# Patient Record
Sex: Female | Born: 1965 | Race: White | Hispanic: No
Health system: Western US, Academic
[De-identification: ages and names within clinical notes are randomized; demographics above are authoritative.]

## PROBLEM LIST (undated history)

## (undated) DIAGNOSIS — R519 Headache, unspecified: Secondary | ICD-10-CM

## (undated) DIAGNOSIS — K469 Unspecified abdominal hernia without obstruction or gangrene: Secondary | ICD-10-CM

## (undated) DIAGNOSIS — E119 Type 2 diabetes mellitus without complications: Secondary | ICD-10-CM

## (undated) DIAGNOSIS — J45909 Unspecified asthma, uncomplicated: Secondary | ICD-10-CM

## (undated) DIAGNOSIS — C50919 Malignant neoplasm of unspecified site of unspecified female breast: Secondary | ICD-10-CM

## (undated) DIAGNOSIS — B001 Herpesviral vesicular dermatitis: Secondary | ICD-10-CM

## (undated) DIAGNOSIS — J309 Allergic rhinitis, unspecified: Secondary | ICD-10-CM

## (undated) HISTORY — DX: Type 2 diabetes mellitus without complications: E11.9

## (undated) HISTORY — DX: Unspecified asthma, uncomplicated: J45.909

## (undated) HISTORY — PX: PR MAMMAPLASTY AUGMENTATION W/O PROSTHETIC IMPLANT: 19324

## (undated) HISTORY — PX: NO PRIOR SURGERIES: 100

## (undated) HISTORY — DX: Herpesviral vesicular dermatitis: B00.1

## (undated) HISTORY — PX: BREAST LUMPECTOMY: SHX5074

## (undated) HISTORY — DX: Headache, unspecified: R51.9

## (undated) HISTORY — PX: SKIN CANCER EXCISION: SHX5110

## (undated) HISTORY — PX: PR APPENDECTOMY: 44950

## (undated) HISTORY — DX: Allergic rhinitis, unspecified: J30.9

## (undated) HISTORY — PX: BREAST RECONSTRUCTION: SHX5075

## (undated) HISTORY — DX: Unspecified abdominal hernia without obstruction or gangrene: K46.9

## (undated) HISTORY — DX: Malignant neoplasm of unspecified site of unspecified female breast: C50.919

## (undated) HISTORY — PX: LYMPH NODE SURGERY: SHX5161

---

## 2006-09-12 DIAGNOSIS — Z923 Personal history of irradiation: Secondary | ICD-10-CM

## 2006-09-12 HISTORY — DX: Personal history of irradiation: Z92.3

## 2014-09-12 DIAGNOSIS — C4490 Unspecified malignant neoplasm of skin, unspecified: Secondary | ICD-10-CM

## 2014-09-12 HISTORY — PX: HERNIA REPAIR: SHX5222

## 2014-09-12 HISTORY — DX: Unspecified malignant neoplasm of skin, unspecified: C44.90

## 2018-09-12 DIAGNOSIS — I10 Essential (primary) hypertension: Secondary | ICD-10-CM

## 2018-09-12 HISTORY — DX: Essential (primary) hypertension: I10

## 2020-07-09 ENCOUNTER — Encounter (HOSPITAL_BASED_OUTPATIENT_CLINIC_OR_DEPARTMENT_OTHER): Payer: Self-pay | Admitting: Plastic and Reconstructive Surgery

## 2020-07-15 ENCOUNTER — Telehealth (HOSPITAL_BASED_OUTPATIENT_CLINIC_OR_DEPARTMENT_OTHER): Payer: Self-pay

## 2020-07-15 NOTE — Telephone Encounter (Signed)
RETURN CALL: Voicemail - Detailed Message      SUBJECT:  General Message     MESSAGE: Margie from [place] is calling to check on the referral status. Lesleigh Noe would like a call back once patient is scheduled so she can start the prior authorization process for patient.

## 2020-07-16 ENCOUNTER — Telehealth (HOSPITAL_BASED_OUTPATIENT_CLINIC_OR_DEPARTMENT_OTHER): Payer: Self-pay | Admitting: Physician Assistant

## 2020-07-16 NOTE — Telephone Encounter (Signed)
RETURN CALL: Voicemail - Not Available      SUBJECT:  Form/Letter/Paperwork Request     DATE NEEDED BY: ASAP  PICK UP/FAX/MAIL: MAIL  ADDITIONAL INFORMATION: Letter for referral was generated today, patients address was incorrect on file. [facility] updated and verified patient demographic information. Please mail to corrected address.    Thanks

## 2020-07-16 NOTE — Telephone Encounter (Signed)
Returned call to [place] at [facility]. Left message to call PSS directly to discuss referral. PSS unable to establish Margie's position with this referral at time of call.

## 2020-12-29 ENCOUNTER — Telehealth (HOSPITAL_BASED_OUTPATIENT_CLINIC_OR_DEPARTMENT_OTHER): Payer: Self-pay

## 2020-12-29 NOTE — Telephone Encounter (Signed)
RETURN CALL: Voicemail - Detailed Message      SUBJECT:  Cancellation/Reschedule Request     REASON: Request for sooner appointment  ADDITIONAL INFORMATION: Patient would like to be seen sooner if possible or have appointment added to a wait list.

## 2021-02-09 ENCOUNTER — Ambulatory Visit (HOSPITAL_BASED_OUTPATIENT_CLINIC_OR_DEPARTMENT_OTHER): Payer: No Typology Code available for payment source | Admitting: Plastic Surgery

## 2021-02-09 ENCOUNTER — Ambulatory Visit: Payer: No Typology Code available for payment source | Attending: Plastic Surgery | Admitting: Plastic Surgery

## 2021-02-09 ENCOUNTER — Encounter (HOSPITAL_BASED_OUTPATIENT_CLINIC_OR_DEPARTMENT_OTHER): Payer: Self-pay | Admitting: Plastic Surgery

## 2021-02-09 VITALS — BP 127/85 | HR 106 | Temp 99.1°F | Ht 64.0 in | Wt 135.0 lb

## 2021-02-09 DIAGNOSIS — C7951 Secondary malignant neoplasm of bone: Secondary | ICD-10-CM | POA: Insufficient documentation

## 2021-02-09 DIAGNOSIS — C50511 Malignant neoplasm of lower-outer quadrant of right female breast: Secondary | ICD-10-CM | POA: Insufficient documentation

## 2021-02-09 DIAGNOSIS — C7802 Secondary malignant neoplasm of left lung: Secondary | ICD-10-CM | POA: Insufficient documentation

## 2021-02-09 DIAGNOSIS — C7801 Secondary malignant neoplasm of right lung: Secondary | ICD-10-CM | POA: Insufficient documentation

## 2021-02-09 DIAGNOSIS — I890 Lymphedema, not elsewhere classified: Secondary | ICD-10-CM | POA: Insufficient documentation

## 2021-02-09 NOTE — Progress Notes (Deleted)
Outpatient Visit Note       CC/History of Present Illness:  Ariana Johnson is a 55 year old female with history of right ER+, PR-, Her2- breast cancer diagnosed in 2008 s/p lumpectomy, axillary lymph node dissection, and chemoradiation. She recalls that 5 nodes were removed during surgery. Patient reports she started to notice swelling in her right arm in 2012, which she was able to control with sleeves, physical therapy, and a compression device. While she had good success with these strategies at first, she has noticed symptoms to be refractory Notably, she has had four episodes cellulitis of the right arm requiring admission in 2016 and 2019. Notably, patient is right handed and has noticed increased discomfort with daily activities for the last three years which has been impairing her quality of life.        For current disease status, patient was diagnosed with local recurrence followed by metastatic disease to her bone and lung. Metastasis have been stable on systemic therapy, and she is currently being treated with Piqray. Her most recent A1c was 7.6 one month ago.            Past Medical history  Past Medical History:   Diagnosis Date   . Hypertension 2020   . Skin cancer 2016   . History of radiation therapy 2008    Sept - Dec 2008   . Allergic rhinitis due to allergen    . Asthma    . Breast cancer ([place])     Stage 2- 2008 then Stage 4 12/2016   . Diabetes mellitus ([place])    . Headache    . Hernia of abdominal cavity     Umblical   . Recurrent cold sores     Occasionally       Past Surgical history  Past Surgical History:   Procedure Laterality Date   . BREAST BIOPSY     . BREAST LUMPECTOMY     . HERNIA REPAIR  7829    Umblical   . LYMPH NODE SURGERY     . PR APPENDECTOMY     . PR MAMMAPLASTY AUGMENTATION W/O PROSTHETIC IMPLANT         Medications  Outpatient Medications Prior to Visit   Medication Sig Dispense Refill   . amLODIPine 10 MG tablet      . atorvastatin 20 MG tablet      . Fulvestrant (FASLODEX  IM) Inject intramuscularly every month.     . Jardiance 25 MG tablet      . loratadine 10 MG tablet Take 10 mg by mouth daily.     . metFORMIN 500 MG tablet Take 500 mg by mouth daily.     . montelukast 10 MG tablet      . Multiple Vitamins-Minerals (ONE-A-DAY WOMENS OR) Take by mouth daily.     . NON-FORMULARY      . Omega-3 Fatty Acids (OMEGA-3 OR) Take by mouth daily.     Marland Kitchen omeprazole 20 MG DR capsule      . Piqray (300 MG Daily Dose) 2 x 150 MG tablet therapy pack        No facility-administered medications prior to visit.       Family history  Family History     Problem (# of Occurrences) Relation (Name,Age of Onset)    Birth Defects (1) Brother: Heart    Breast Cancer (1) Paternal Grandmother    Cancer (2) Father: Mesothelioma, Daughter: Hodkins lymphoma  Clotting Disorder (2) Mother: Kidney, Other: Aunt- Brain; Cousins- Lung    Diabetes (2) Mother, Father    Hearing Loss (1) Father    Skin Cancer (1) Mother    Vision Loss (31) Mother, Father, Sister, Brother          Patient denies family history of prostate, bladder, kidney, testicular cancer    Allergy  Review of patient's allergies indicates:  Allergies   Allergen Reactions   . Latex Resp: Shortness of breath   . Sulfa Drugs [Sulfa Antibiotics] Angioedema/lip, tongue swelling     "Lip swelling"   . Lactose Intolerance (Gi) Other     Upset stomach        Social history  Tobacco Use: Medium Risk   . Smoking Tobacco Use: Former Smoker   . Smokeless Tobacco Use: Never Used       Review of Systems:    Physical Exam:  BP 127/85   Pulse (!) 106   Temp 37.3 C (Temporal)   Ht _0  (1.626 m)   Wt 61.2 kg (135 lb)   SpO2 99%   BMI 23.17 kg/m     Physical Exam    Imaging/Diagnostics:    Imaging Results:         Assessment/Plan:      It was a pleasure to see Ariana Johnson in clinic today.       Discussed the course of lymphedema and the role of surgery, including physiological options such as bypass or lymph node packet transplant, and reductive options,  including liposuction. Discussed that, without lymphoscintigraphy, it is difficult to determine if patient's remaining lymphatic tissues are functional and amenable to physiological options, which would not only reduce lymphedema symptoms but decrease risk of recurrent infections. If study should reveal loss of lymphatic function, she would not likely be a candidate for functional intervention and reductive intervention with liposuction would be her mainstay of therapy.     Plan for lymphoscintigraphy and MR lymphangiogram. Will plan for clinic follow-up in two months to discuss results of images.

## 2021-02-09 NOTE — Progress Notes (Deleted)
error 

## 2021-02-11 ENCOUNTER — Encounter (HOSPITAL_BASED_OUTPATIENT_CLINIC_OR_DEPARTMENT_OTHER): Payer: Self-pay | Admitting: Plastic Surgery

## 2021-02-14 NOTE — Addendum Note (Signed)
Addended by: Valetta Mole B on: 02/14/2021 10:08 PM     Modules accepted: Level of Service

## 2021-02-14 NOTE — Progress Notes (Signed)
PLASTIC SURGERY CLINIC VISIT NOTE    Reason for visit: New Patient Consult (RUE swelling & pain)    HPI:  Ariana Johnson is a 55 year old female RHD presenting for evaluation of RUE lymphedema, She has a history of right ER+, PR-, Her2- breast cancer in 2008 s/p right lumpectomy, axillary dissection (5 nodes removed), and chemoradiation.  She reports that she first experienced right arm swelling in 2012.  She has used compression sleeves, a pump, and worked with a lymphedema therapist for complex decongestive therapy.  Despite all of this, her lymphedema has progressed. She required admission for IV abx for cellulitis on four occassions.    She reports that her right arm is heavy, has constant discomfort, and has negatively impacted her activities of daily living and quality of life.  She reports that her hand swells as well. The swelling is worse as the day progresses.    Notably, she was found to have a local recurrence that is metastatic to her bone and lung.  She is currently being treated with Piqray therapy and reports that the progression of her disease has stabilized on the drug.      PAST MEDICAL HISTORY:  Past Medical History:   Diagnosis Date   . Hypertension 2020   . Skin cancer 2016   . History of radiation therapy 2008    Sept - Dec 2008   . Allergic rhinitis due to allergen    . Asthma    . Breast cancer ([place])     Stage 2- 2008 then Stage 4 12/2016   . Diabetes mellitus ([place])    . Headache    . Hernia of abdominal cavity     Umblical   . NEGATIVE PAST MEDICAL HISTORY OF Metastatic breast cancer    Lymphadema 2012   . Recurrent cold sores     Occasionally       PAST SURGICAL HISTORY:  Past Surgical History:   Procedure Laterality Date   . BREAST BIOPSY     . BREAST LUMPECTOMY     . BREAST RECONSTRUCTION  L breast plasty 2016   . HERNIA REPAIR  4854    Umblical   . LYMPH NODE SURGERY     . NO PRIOR SURGERIES  2008   . PR APPENDECTOMY     . PR MAMMAPLASTY AUGMENTATION W/O PROSTHETIC IMPLANT     . SKIN  CANCER EXCISION  2016       MEDICATIONS:  Current Outpatient Medications   Medication Sig Dispense Refill   . amLODIPine 10 MG tablet      . atorvastatin 20 MG tablet      . Fulvestrant (FASLODEX IM) Inject intramuscularly every month.     . Jardiance 25 MG tablet      . loratadine 10 MG tablet Take 10 mg by mouth daily.     . metFORMIN 500 MG tablet Take 500 mg by mouth daily.     . montelukast 10 MG tablet      . Multiple Vitamins-Minerals (ONE-A-DAY WOMENS OR) Take by mouth daily.     . NON-FORMULARY      . Omega-3 Fatty Acids (OMEGA-3 OR) Take by mouth daily.     Marland Kitchen omeprazole 20 MG DR capsule      . Piqray (300 MG Daily Dose) 2 x 150 MG tablet therapy pack        No current facility-administered medications for this visit.       ALLERGIES:  Review of patient's allergies indicates:  Allergies   Allergen Reactions   . Latex Resp: Shortness of breath   . Sulfa Drugs [Sulfa Antibiotics] Angioedema/lip, tongue swelling     "Lip swelling"   . Lactose Intolerance (Gi) Other     Upset stomach        FAMILY HISTORY:  family history includes Birth Defects in her brother; Breast Cancer in her paternal grandmother; Cancer in her daughter, father, and mother; Clotting Disorder in her mother and another family member; Diabetes in her father and mother; Hearing Loss in her father; Skin Cancer in her mother; Stroke in her father; Vision Loss in her brother, father, mother, and sister.      SOCIAL HISTORY:  Social History     Tobacco Use   . Smoking status: Former Smoker     Types: Cigarettes     Quit date: 1996     Years since quitting: 26.4   . Smokeless tobacco: Never Used   Substance Use Topics   . Alcohol use: Yes     Alcohol/week: 1.0 standard drink     Types: 1 Cans of beer per week     Comment: Maybe 1-2 weekly   . Drug use: Never       REVIEW OF SYSTEMS:  Thorough review of systems is negative for any complaint not mentioned above in the HPI. Patient specifically denies chest pain, shortness of breath, change in bowel  habits, changes in weight, fatigue/anorexia, neurologic symptoms.    PHYSICAL EXAM:  BP 127/85   Pulse (!) 106   Temp 37.3 C (Temporal)   Ht 5' 4"  (1.626 m)   Wt 61.2 kg (135 lb)   SpO2 99%   BMI 23.17 kg/m   General: resting comfortably, no distress  Focused examination of bilateral upper extremities:  Right arm is diffusely swollen and larger than her left arm. Nonpitting edema present on upper arm and forearm. Pitting edema overlying hand. + Stemmers sign. No open wounds. Skin appears reasonable quality. Axilla without significant scaring.    LDex: 69.6    ASSESSMENT/PLAN:  Ariana Johnson is a very pleasant 55 year old woman with prior history of right breast cancer s/p lumpectomy, axillary dissection (removal of 5 LNs) and radiation now with metastatic disease stable on Piqray therapy presenting with Grade 2 lymphedema of the right arm that is significantly impairing her quality of life.     Discussed the diagnosis and treatment of lymphedema with the patient and may be amenable to lymphatic procedures as she still demonstrates pitting edema in her hand.  We informed the patient that the goals of lymphatic surgery are to 1) decrease the subjective symptoms of lymphedema, 2) decrease risk of infections, and 3) decrease the burden of compression and exercises, but the lymphedema will likely not fully go away.     Surgical options for lymphedema are divided into two categories: Excisional (ablative) and Reconstructive (physiologic). Excisional procedures include the [facility] Procedure, direct excision of lymphedematous folds and liposuction. None of these procedures actually treat the lymphedema but they do treat the sequelae of lymphedema. Patients still require compression to treat their lymphedema. There are essentially 2 reconstructive procedures that can be done. The first is Lymphatico-venular anastomosis,(LVA) and the second is vascularized lymph node transfer (VLNT).     LVA works by connecting tiny  lymphatics just under the dermis to small venules with the goal to dump the lymphatic fluid into the circulatory system in the arm. Since the venous  system is functioning normally, this helps transport the lymph out of the limb. Typically between 4 and 10 anastomoses are done, or at least as many as possible. This is an easy operation from the point of view of the patient since it is an outpatient procedure, takes about 4 hrs, and the incisions are very small and very superficial so there is not a lot of downtime. There is very little downside to this operation though there is a theoretical increased risk of post-operative wound infection.     VLNT is intuitively a good operation since it consists of replacing lymph nodes in the area from which they have been removed. These lymph nodes secrete a protein called VEGF-C which is lymphangiogenic and causes new lymph channels to grow out into the surrounding tissue. Lymph nodes can be harvested from the groin, the lateral thoracic region, or the neck, either supraclavicular or submental. Lymphoid tissue can also be harvested from the omentum and this is done laparoscopically. The major downside of VLNT is the risk (though small) of inducing lymphedema at the donor site.      One of the biggest decisions to be made is when to do which operation. To determine this, an MRI Lymphangiogram will be performed. This can show the lymphatic channels. If we can see functioning lymphatics, then LVA may work well. If there are none, then VLNT is the better operation. Outcomes seem to be similar for both procedures with approximately 10% of patients achieving a spectacular result, 10% of patients having no response and the majority having some response. For most patients in this category, they are able to reduce their reliance on compression though most will still require it, especially in situations where they know the limb is going to swell (e.g. a plane ride).       The procedures  were discussed in detail with the patient today including expected recovery, pain, activity limitations, and potential complications.  Risks including but not limited to infection, bleeding, wound healing complications, scarring, DVT, PE, stroke, and general anesthesia risks were discussed.     We will order a lymphoscintigraphy and MRL for this patient of her right upper extremity and have her return to discuss the results.    In addition, we may require her to stop her Piqray therapy during the perioperative period.  We will plan to have a further discussions with her oncologist pending the results of the ordered studies.    - RTC in 2 months to discuss lymphoscintigraphy, MRL, and surgical options.  - Continue with daily compression and decongestive therapy.    Rejeana Brock Rhodia Albright, MD  Attending Physician, Assistant Professor  Division of [place] [facility] [place]. [place]  [place], [place], [place]

## 2021-03-08 NOTE — Progress Notes (Deleted)
{  PLASSITE:[id number]} - Follow Up    Ariana Johnson, Ariana Johnson  DOB: [birth year]  CC: follow up for lymphedema evaluation         SUBJECTIVE   Ariana Johnson is a 55 year old female with history of R breast cancer s/p lumpectomy, axillary dissection (5 nodes removed), and chemoradiation (2008), and RUE lymphedema presenting for further management of RUE swelling.     Pt reports she first experienced R arm swelling in 2012. She initially used compression sleeves, a pump, and lymphedema specialist but her lymphedema continued to progress. She required admission for cellulitis and IV abx on several occasions. Notably, she had recurrence of breast cancer with mets to bone and lung, for which she is being tx with Piqray therapy. She was first seen on 02/09/21, at which time she reported heavy right arm and constant discomfort. Surgical treatments were discussed, including LVA and VLNT, but it was decided she would need MRI lymphangiogram first, which showed ________    ROS  Attached Review of Systems (entered/ scanned by MA) reviewed and confirmed? {YES - DEFAULT:107767::"YES"}       OBJECTIVE   There were no vitals taken for this visit.  PHYSICAL EXAM  @CJKPHYSICALEXAMCLINIC @    RECORDS REVIEW  MICRO:   {Microbiology Results (multi-select):114230::" "}    IMAGING:   {Imaging AYOKHTX:774142::" "}       ASSESSMENT AND PLAN   Ariana Johnson is a 55 year old female with history of R breast cancer s/p lumpectomy, axillary dissection (5 nodes removed), and chemoradiation (2008), and RUE lymphedema presenting for further management of RUE swelling.

## 2021-03-10 ENCOUNTER — Inpatient Hospital Stay (HOSPITAL_COMMUNITY)
Admit: 2021-03-10 | Discharge: 2021-03-10 | Disposition: A | Discharge status: Home / Self Care | Payer: No Typology Code available for payment source | Source: Home / Self Care

## 2021-03-10 ENCOUNTER — Inpatient Hospital Stay
Admission: RE | Admit: 2021-03-10 | Discharge: 2021-03-10 | Disposition: A | Discharge status: Home / Self Care | Payer: No Typology Code available for payment source | Attending: Nuclear Medicine | Admitting: Nuclear Medicine

## 2021-03-10 ENCOUNTER — Encounter (HOSPITAL_COMMUNITY): Payer: Self-pay

## 2021-03-10 DIAGNOSIS — I890 Lymphedema, not elsewhere classified: Secondary | ICD-10-CM

## 2021-03-10 MED ORDER — TECHNETIUM TC-99M FILTERED SULFUR COLLOID UWM SOLUTION
1.0000 | Freq: Once | Status: DC | PRN
Start: 2021-03-10 — End: 2021-03-11
  Administered 2021-03-10: 1.05 via INTRADERMAL

## 2021-03-10 MED ORDER — TECHNETIUM TC-99M FILTERED SULFUR COLLOID UWM SOLUTION
1.0000 | Freq: Once | Status: AC
Start: 2021-03-10 — End: 2021-03-10
  Administered 2021-03-10: 1.05 via INTRADERMAL

## 2021-03-11 ENCOUNTER — Encounter (HOSPITAL_COMMUNITY): Payer: Self-pay

## 2021-03-11 ENCOUNTER — Ambulatory Visit
Admission: RE | Admit: 2021-03-11 | Discharge: 2021-03-11 | Disposition: A | Discharge status: Home / Self Care | Payer: No Typology Code available for payment source | Attending: Diagnostic Radiology | Admitting: Diagnostic Radiology

## 2021-03-11 DIAGNOSIS — I890 Lymphedema, not elsewhere classified: Secondary | ICD-10-CM | POA: Insufficient documentation

## 2021-03-11 MED ORDER — GADOBENATE DIMEGLUMINE 529 MG/ML IV SOLN
2.5000 mL | Freq: Once | INTRAVENOUS | Status: AC | PRN
Start: 2021-03-11 — End: 2021-03-11
  Administered 2021-03-11: 1.25 mmol via INTRAVENOUS

## 2021-03-11 MED ORDER — FERUMOXYTOL 510 MG/17ML IV SOLN
510.0000 mg | Freq: Once | INTRAVENOUS | Status: AC
Start: 2021-03-11 — End: 2021-03-11
  Administered 2021-03-11: 510 mg via INTRAVENOUS
  Filled 2021-03-11: qty 17

## 2021-03-12 ENCOUNTER — Ambulatory Visit: Payer: No Typology Code available for payment source | Attending: Plastic Surgery | Admitting: Plastic Surgery

## 2021-03-12 VITALS — BP 117/78 | HR 101 | Temp 99.4°F | Ht 64.0 in | Wt 138.0 lb

## 2021-03-12 DIAGNOSIS — I890 Lymphedema, not elsewhere classified: Secondary | ICD-10-CM

## 2021-03-17 NOTE — Progress Notes (Signed)
Plastic Surgery Follow-Up Patient Visit    CC:  Discuss findings of lymphoscintigraphy and MRL    HPI: Ariana Johnson is a 55 year old female with history of R breast cancer s/p lumpectomy, axillary dissection (5 nodes removed), and chemoradiation (2008), and diagnosis of right upper extremity lymphedema. We initially met in May of this year. At that time, we ordered a lymphoscintigraphy and MR lymphangiogram to further evaluate her disease and determine which surgical options she may be a candidate for.        Exam:  Vitals:    03/12/21 0941   Temp: 37.4 C   Pulse: (!) 101   BP: 117/78   SpO2: 95%   Height: 5' 4"  (1.626 m)   Weight: 62.6 kg (138 lb)        General: Awake, alert, well-appearing  HEENT: NCAT  Chest: Normal work of breathing, bilateral chest rise  Focused examination of bilateral upper extremities:  Right arm is diffusely swollen and larger than her left arm. Nonpitting edema present on upper arm and forearm. Pitting edema overlying hand. + Stemmers sign. No open wounds. Skin appears reasonable quality. Axilla without significant scaring.    Lymphoscintigraphy 03/10/2021  IMPRESSION  1. Complete obstruction of the right arm lymphatics via scintigraphic evaluation.    2. Normal drainage from the left arm lymphatics. No left-sided obstruction.    MRI Lymphangiogram 03/11/2021  - No visible lymphatic channels on my interpretation of images. Dye remains in dorsum of hand.    Assessment and Plan:  Ariana Johnson is a 55 year old female with stage lymphedema of the right upper extremity.  Her imaging is consistent with a diagnosis of lymphedema. She does not appear to have functioning lymphatics in her right upper extremity and thus would not be a candidate for an LVB.  We discussed that she would possibly be a candidate for a VLNT.  We discussed the different donor sites that could include the groin, neck, or abdomen.  [facility] was not interested in having the groin or neck used as a donor site.    We  discussed that in order to proceed with a VLNT, she would need to hold her [place] therapy during the preoperative period and she would require clearance from her oncologist to proceed.     We also discussed debulking options with can include minimally invasive tissue excision.  We discussed that this may help with some of the heaviness of her arm, but it may not necessarily decrease the frequency of her infections. In addition, she would also need to be off of her Piqray therapy during the perioperative period.        Rejeana Brock Rhodia Albright, MD  Attending Physician, Assistant Professor  Division of [place] [facility] [place]. [place]  [place], [place], [place]

## 2021-04-06 ENCOUNTER — Telehealth (HOSPITAL_BASED_OUTPATIENT_CLINIC_OR_DEPARTMENT_OTHER): Payer: Self-pay | Admitting: Nursing

## 2021-04-06 NOTE — Telephone Encounter (Signed)
11:53 AM spoke with Ariana Johnson in regards to the incidental liver lesion findings on her MRL. [name] reports she is already aware of the liver mets as it was diagnosed on a scan sometime last year. She is currently treating the mets with medication. [name] was very grateful for Dr.Lentz and her team reaching out and has no other questions at this time.

## 2021-04-16 ENCOUNTER — Encounter (HOSPITAL_BASED_OUTPATIENT_CLINIC_OR_DEPARTMENT_OTHER): Payer: No Typology Code available for payment source | Admitting: Plastic Surgery

## 2021-05-24 IMAGING — CT CT CHEST/ABD/PELVIS W CON
2 of 6 series · 10 of 46 positions shown, 11 images · non-contrast
Comparison: none

[Series 4: soft tissue · axial · 0.47mm/px · z∈[+1258,+1763]mm · 7 of 123 slices shown, 8 images]
[im 11/123  soft-tissue]
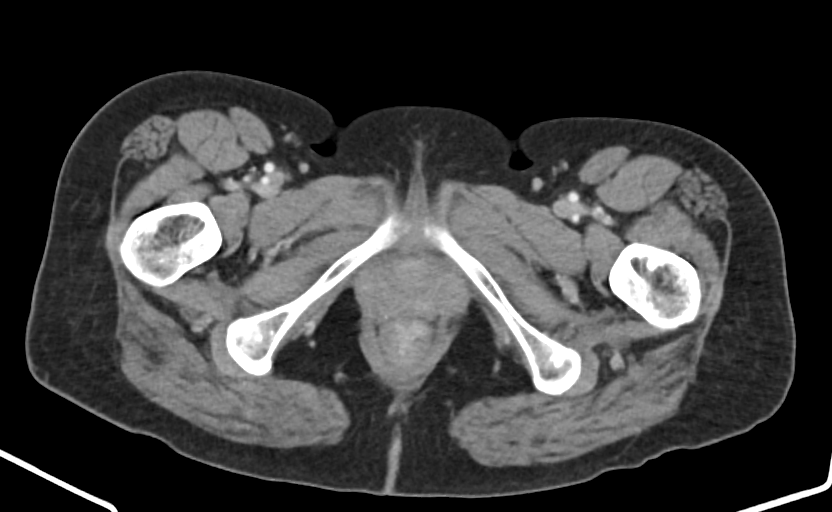
[im 11/123  bone]
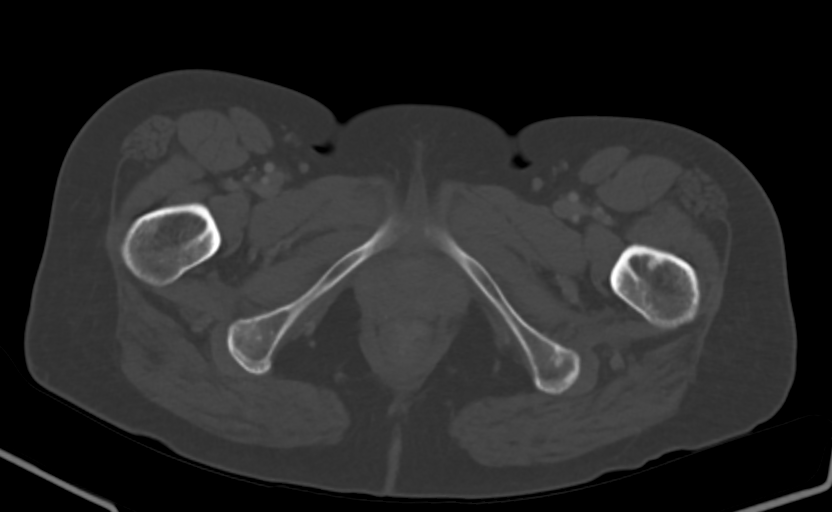
[im 31/123  soft-tissue]
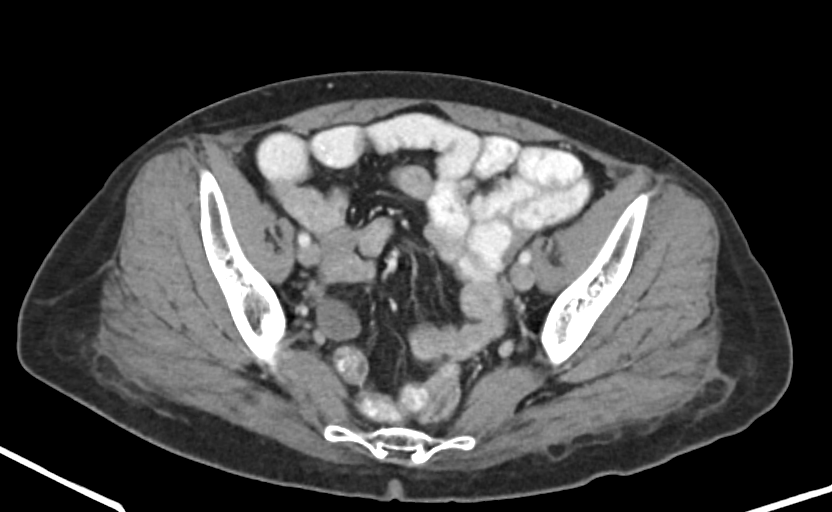
[im 41/123  soft-tissue]
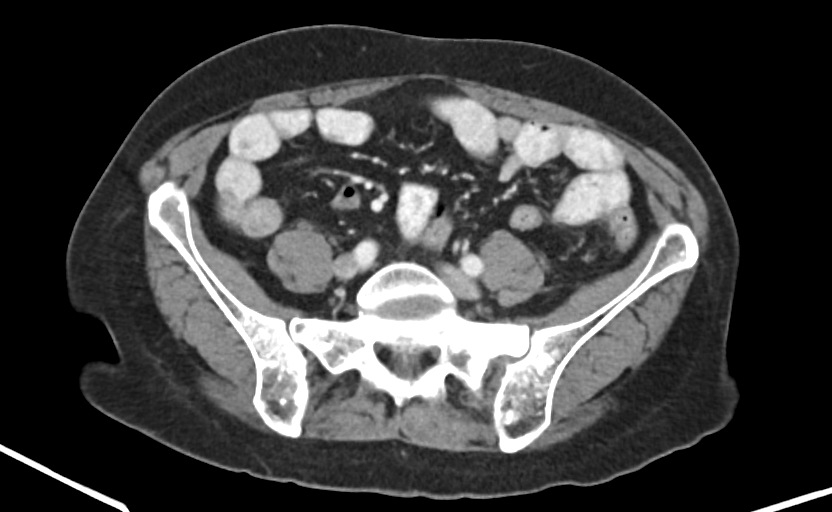
[im 62/123  soft-tissue]
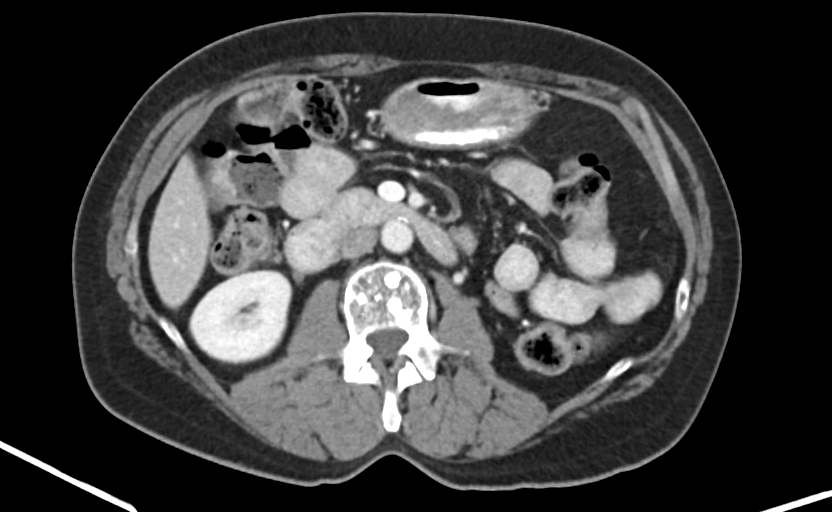
[im 82/123  soft-tissue]
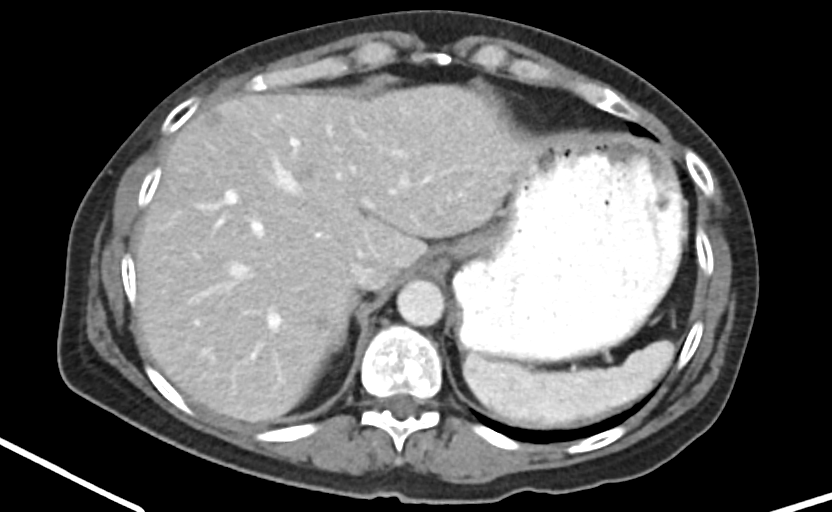
[im 92/123  soft-tissue]
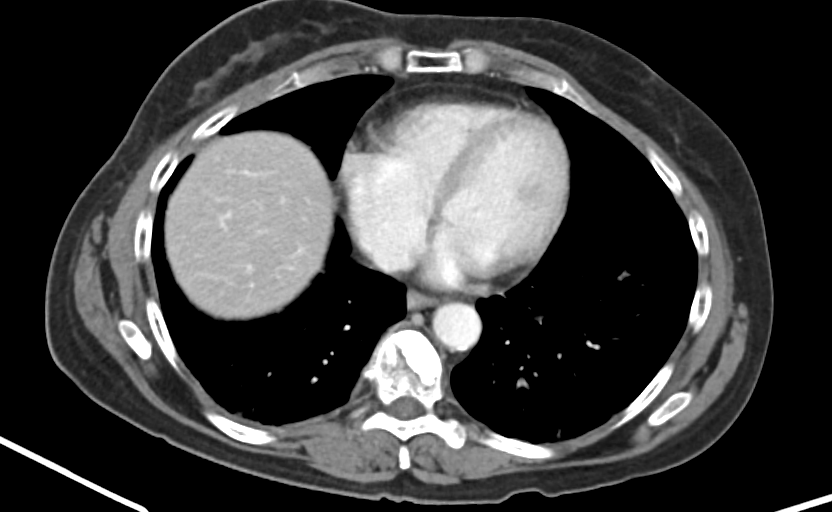
[im 112/123  soft-tissue]
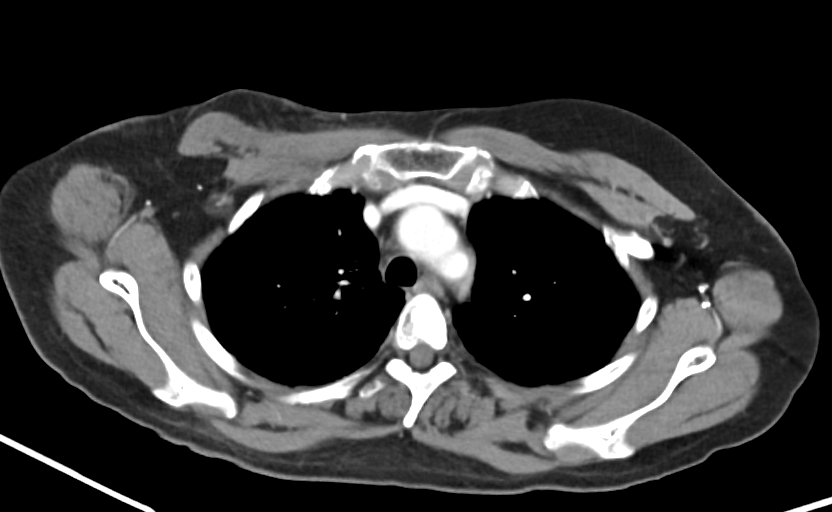

[Series 6: coronal · coronal · 0.76mm/px · 3 of 48 slices shown]
[im 16/48  soft-tissue]
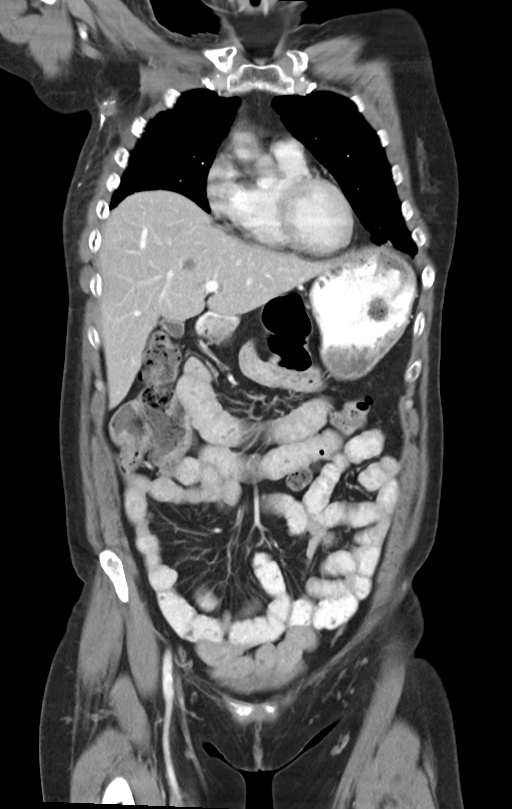
[im 21/48  soft-tissue]
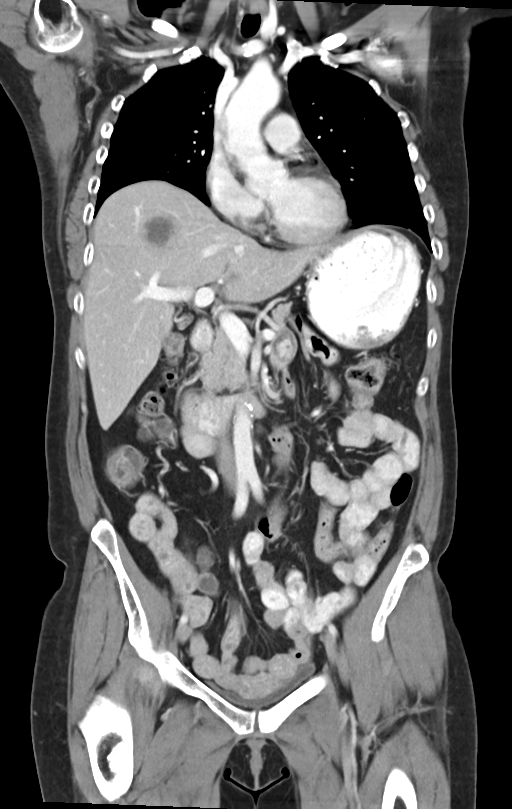
[im 27/48  soft-tissue]
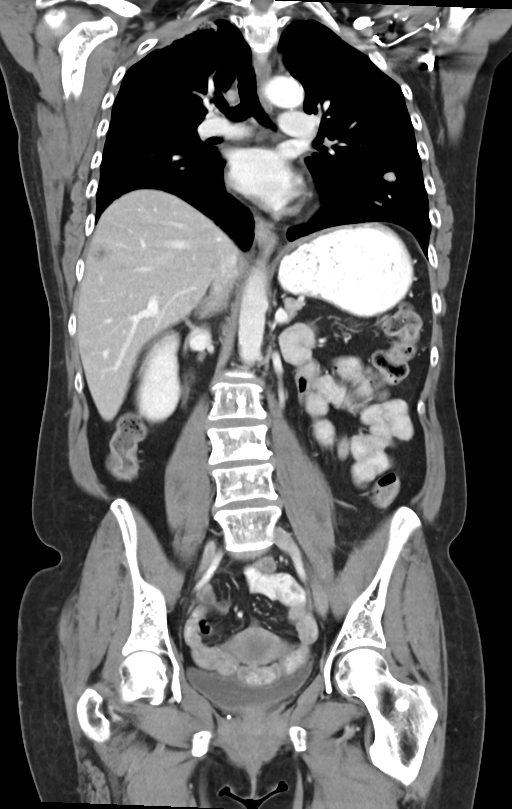

[10 of 46 positions shown; findings below may reference images not displayed]

REASON FOR EXAM

*
Malignant neoplasm of upper-outer quadrant of right female breast

*
Per patient 3 month follow-up 

COMPARISON

*
CT chest, abdomen, pelvis 01/05/2021 ([facility])

TECHNIQUE

*
CT images of the chest, abdomen, and pelvis were acquired after the administration of 100 mL Ksovue-J0V IV contrast

*
900 mL Readi-Cat oral contrast

*
i-STAT creatinine 0.7 mg/dL

*
Total radiation dose to patient is CTDIvol 21.42 mGy and DLP 757.70 mGy-cm.

*
Dose reduction technique used: Automated exposure control and adjustment of the mA and/or kV according to patient size

*
CT count previous 12 months:  0

FINDINGS

Mediastinum and Thoracic Inlet

*
No enlarged lymph nodes

*
Normal size heart

*
Physiologic amount of pericardial fluid

*
Coronary artery calcification

Lungs and Airways

*
Patent central airways

*
Suspicious pulmonary nodules are similar to the 01/05/2021 exam, 2 examples listed below

*
Left lower lobe, anteromedial basilar segment, solid nodule ([DATE])   1.2 cm   --->   1.3 cm

*
Left lower lobe, superior segment, solid nodule ([DATE])   0.6 cm   --->   0.6 cm

*
Lungs also contain patchy groundglass disease (mild)

*
No segmental or lobar consolidation

Pleura

*
No effusion

*
No nodularity

Breasts and Axilla

*
Right lumpectomy with residual scarring

*
Right axillary lymph node sampling

*
No enlarged axillary lymph nodes

Chest Wall

*
Numerous sclerotic lesions are similar in size and number to the 01/05/2021 exam, one example listed below

*
T3 sclerotic metastases ([DATE])   1.8 cm   --->   1.7 cm

Liver Lesions

*
Number of lesions has increased from 2 to 8 since 01/05/2021

*
The lesions are also enlarging, 2 examples listed below

*
Segment IVb metastasis ([DATE])   1.3 cm   --->   2.4 cm

*
Segment VIII metastasis ([DATE])   1.8 cm   --->   3.4 cm

 Liver

*
Smooth margins

Gallbladder, Pancreas, Spleen

*
Heterogeneously enhancing spleen (difficult to evaluate for focal disease, especially in comparison to the prior arterial phase exam)

Adrenals, Kidneys, Urinary Bladder, Reproductive

*
Adenomatous thickening of the left adrenal glands

*
Left kidney not visualized

*
Right renal caliectasis (without hydronephrosis)

*
Endometrial stripe thickness is difficult to evaluate on this modality

Gastrointestinal

*
Cecum lies in the right upper quadrant

*
Long redundant sigmoid colon

Peritoneum and Retroperitoneum

*
No enlarged lymph nodes

*
No ascites

*
No signs of peritoneal implants

Bones and Superficial Tissues

*
Mesh repair of the periumbilical abdominal wall

*
Numerous sclerotic lesions in the lumbar spine and pelvic bones are also similar to the 01/05/2021 exam

IMPRESSION

*
Hepatic metastases have increased in size and number since 01/05/2021

*
Suspicious pulmonary nodules are stable

*
Sclerotic osseous metastases are also stable since 01/05/2021

*
Splenic lesions are not well evaluated on today's exam

## 2021-06-28 IMAGING — MR MRI BRAIN W/WO CONTRAST
13 series · 48 of 48 positions shown · IV contrast (15CC PROHANCE)
Comparison: Whole-body bone scan study, CT of chest, abdomen and pelvis study 05/24/2021.

HISTORY: Other visual disturbances. History of breast cancer.
TECHNIQUE: Multiplanar, multisequential MRI images of the brain are obtained prior to and following 15 mL ProHance intravenous contrast.

[Series 1: bSSFP · axial · 8.0mm · 1.17mm/px · 1 of 19 slices shown]
[im 1/19]
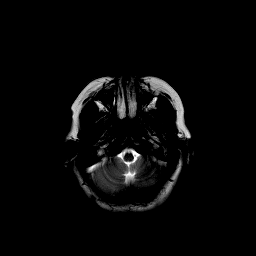

[Series 2: t1_mprage_axial · axial · 1.0mm · 1.00mm/px · z∈[-73,+85]mm · 5 of 160 slices shown]
[im 1/160]
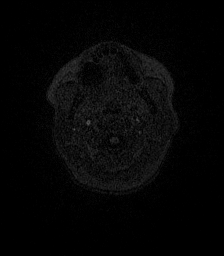
[im 40/160]
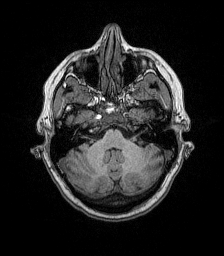
[im 80/160]
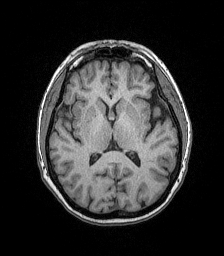
[im 120/160]
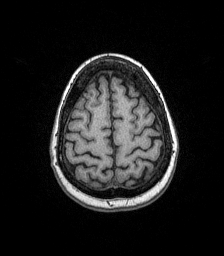
[im 160/160]
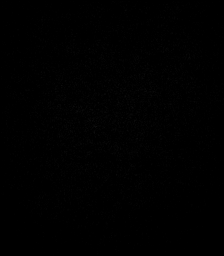

[Series 3: flair_axial_fs · axial · 5.0mm · 0.45mm/px · 1 of 24 slices shown]
[im 1/24]
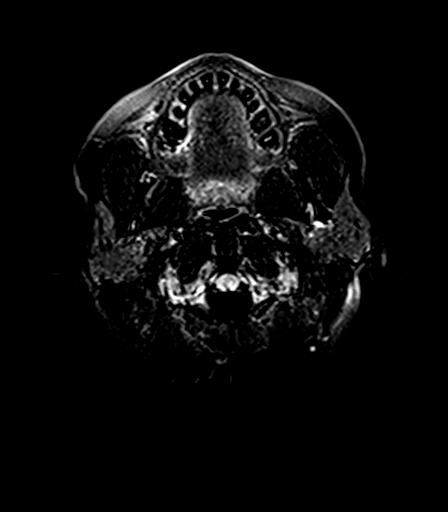

[Series 4: t2_axial · axial · 5.0mm · 0.72mm/px · 1 of 24 slices shown]
[im 1/24]
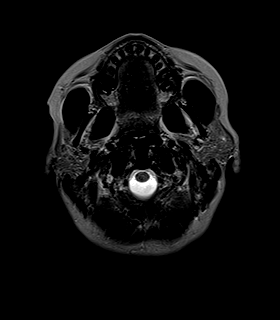

[Series 5: DWI · axial · 5.0mm · 1.26mm/px · 1 of 23 slices shown (1 of 2)]
[im 1/23]
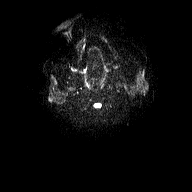

[Series 6: DWI · axial · 5.0mm · 1.26mm/px · 1 of 23 slices shown (2 of 2)]
[im 1/23]
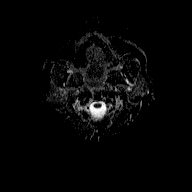

[Series 7: flash_axial · axial · 5.0mm · 0.45mm/px · 1 of 24 slices shown]
[im 1/24]
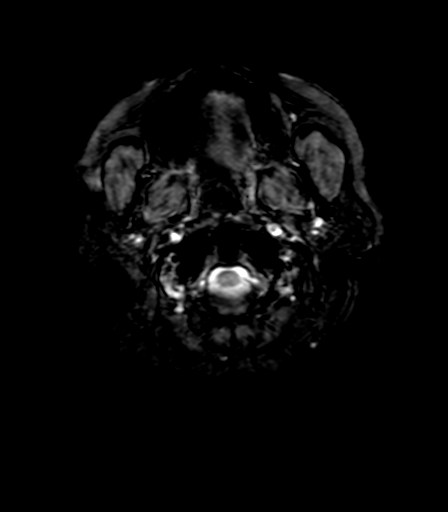

[Series 8: t1_mprage_axial_+c · axial · 1.0mm · 1.00mm/px · z∈[-73,+85]mm · 5 of 160 slices shown]
[im 1/160]
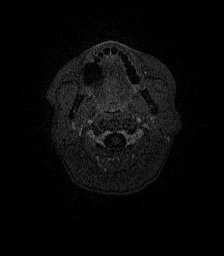
[im 40/160]
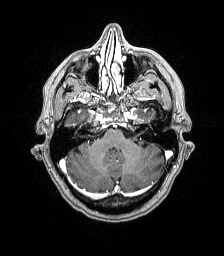
[im 80/160]
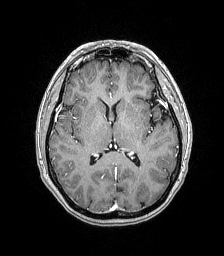
[im 120/160]
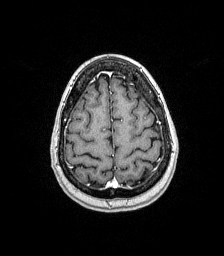
[im 160/160]
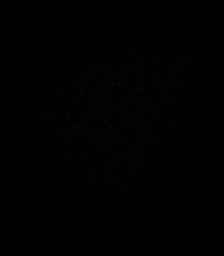

[Series 9: sub_s8-s2_1 · axial · 1.0mm · 1.00mm/px · z∈[-73,+85]mm · 6 of 160 slices shown]
[im 1/160]
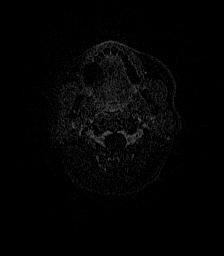
[im 32/160]
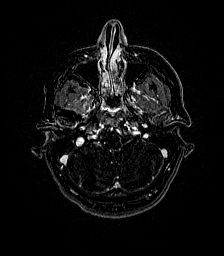
[im 64/160]
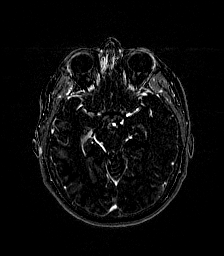
[im 96/160]
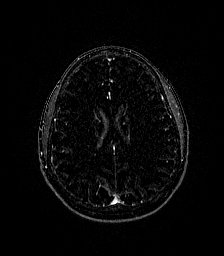
[im 128/160]
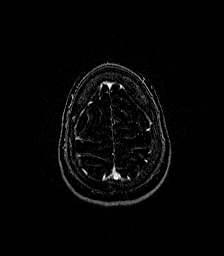
[im 160/160]
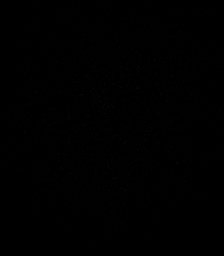

[Series 10: t1_mprage_cor_reformat · coronal · 1.5mm · 1.00mm/px · 7 of 190 slices shown]
[im 1/190]
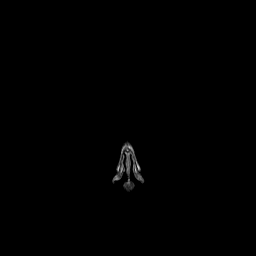
[im 32/190]
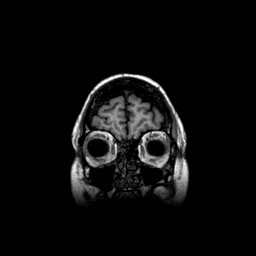
[im 64/190]
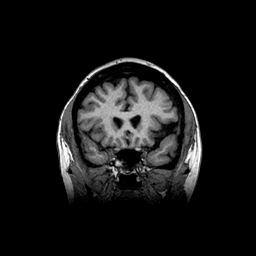
[im 95/190]
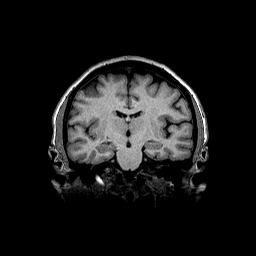
[im 127/190]
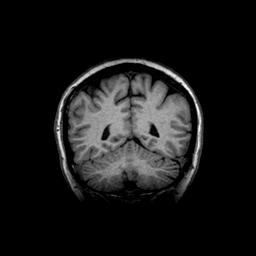
[im 158/190]
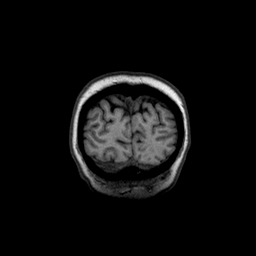
[im 190/190]
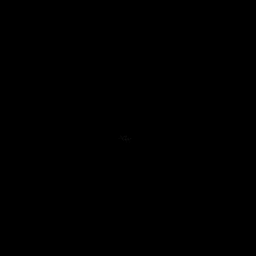

[Series 11: t1_mprage_sag_reformat · sagittal · 1.5mm · 1.00mm/px · 6 of 165 slices shown]
[im 1/165]
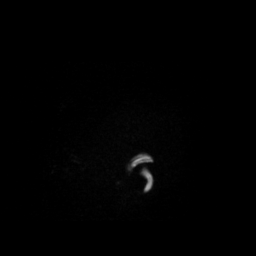
[im 33/165]
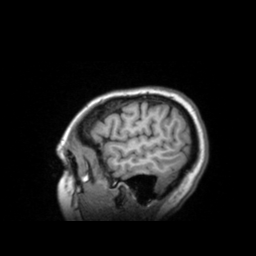
[im 66/165]
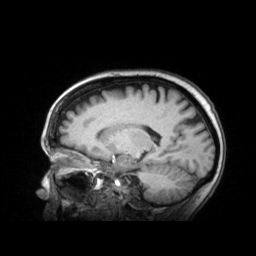
[im 99/165]
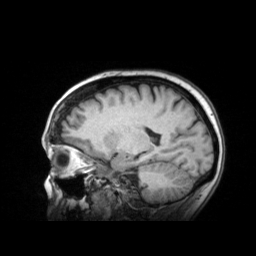
[im 132/165]
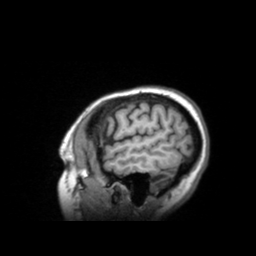
[im 165/165]
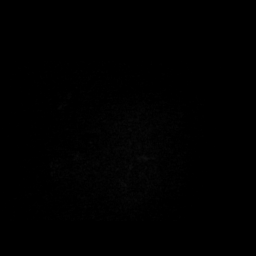

[Series 12: t1_mprage_sag_reformat_+c · sagittal · 1.5mm · 1.00mm/px · 6 of 163 slices shown]
[im 1/163]
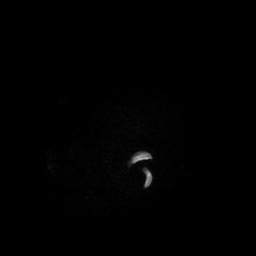
[im 33/163]
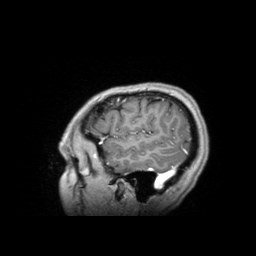
[im 65/163]
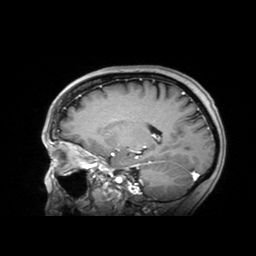
[im 98/163]
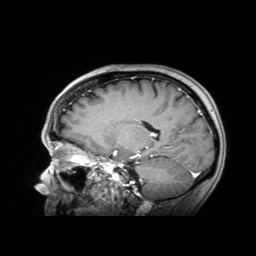
[im 130/163]
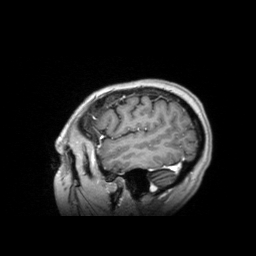
[im 163/163]
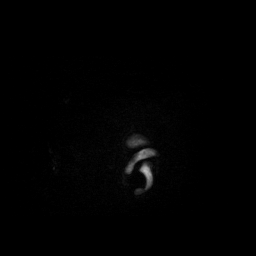

[Series 13: t1_mprage_cor_reformat_+c · coronal · 1.5mm · 1.00mm/px · 7 of 189 slices shown]
[im 1/189]
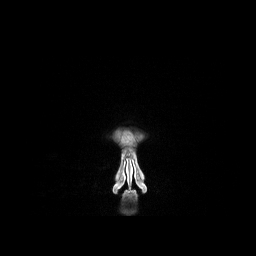
[im 32/189]
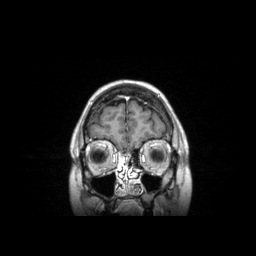
[im 63/189]
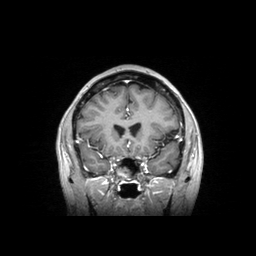
[im 95/189]
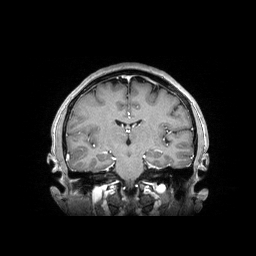
[im 126/189]
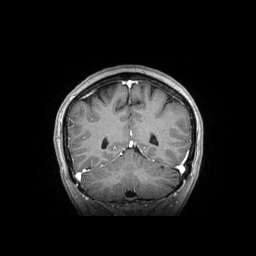
[im 157/189]
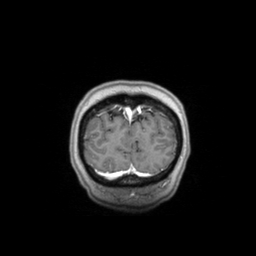
[im 189/189]
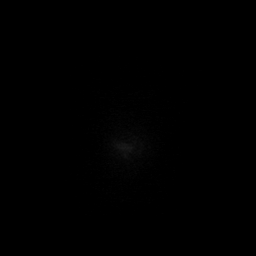

[48 of 48 positions shown; findings below may reference images not displayed]

FINDINGS: Ventricles and cortical sulci are normal. There is no abnormal intra-axial signal. Partially empty sella seen. Midline structures are otherwise normal. Flow voids of main intracranial vasculature are normal. There is no mass lesion or midline shift. No extra-axial fluid collection identified. No area of restricted diffusion, hemosiderin deposit or abnormal enhancement seen. Mucosal thickening of ethmoid air cells, maxillary sinuses, right frontal sinus and right sphenoid sinus seen. The rest of the orbits, paranasal sinuses, mastoid air cells and skull marrow signal are normal.
IMPRESSION: Chronic paranasal sinusitis seen.

No acute intracranial pathology.

No metastasis to brain or skull is identified.

## 2021-07-31 IMAGING — MR MRI HUMERUS RT WO CONTRAST
12 series · 40 of 40 positions shown · non-contrast
Comparison: Correlation is made to CT of the chest, abdomen, and pelvis from 05/24/2021 and bone scintigraphy from 05/24/2021.

INDICATION: Lymphedema.  Right arm swelling.
TECHNIQUE: Multiplanar, multisequence MR imaging of the right humerus was performed without contrast.

[Series 3: t1_axial · axial · 5.0mm · 0.88mm/px · z∈[-96,+146]mm · 4 of 40 slices shown (1 of 2)]
[im 1/40]
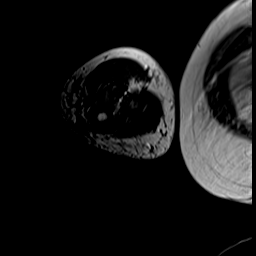
[im 14/40]
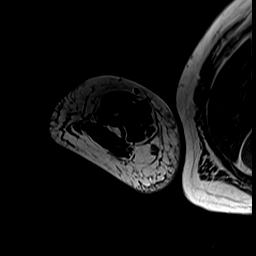
[im 27/40]
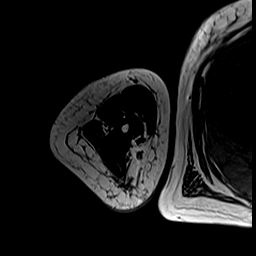
[im 40/40]
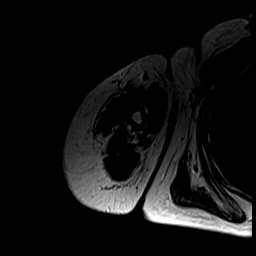

[Series 4: t2_axial_fs · axial · 5.0mm · 0.88mm/px · z∈[-96,+146]mm · 4 of 40 slices shown (1 of 2)]
[im 1/40]
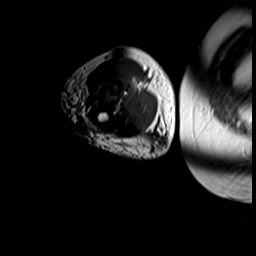
[im 14/40]
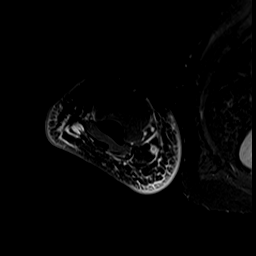
[im 27/40]
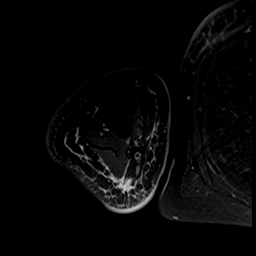
[im 40/40]
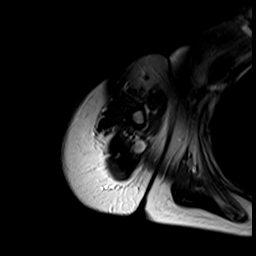

[Series 5: t1_sag · sagittal · 4.0mm · 1.17mm/px · 3 of 25 slices shown (1 of 2)]
[im 1/25]
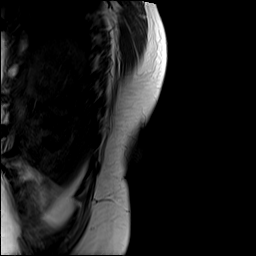
[im 13/25]
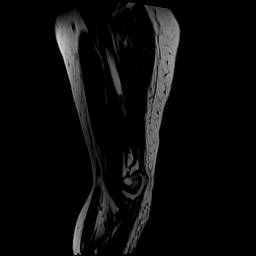
[im 25/25]
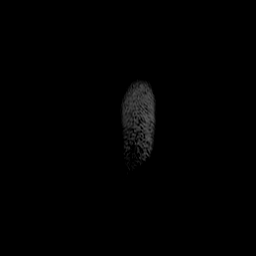

[Series 6: t2_sag_fs · sagittal · 4.0mm · 1.17mm/px · 3 of 25 slices shown (1 of 2)]
[im 1/25]
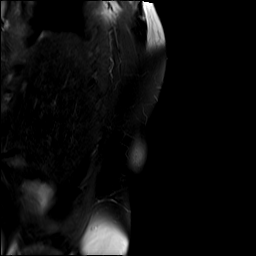
[im 13/25]
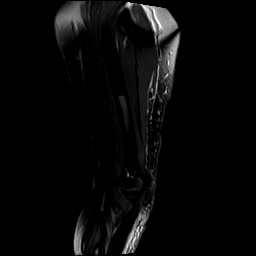
[im 25/25]
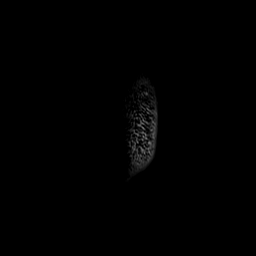

[Series 7: t1_cor · oblique · 4.5mm · 1.17mm/px · 2 of 22 slices shown (1 of 2)]
[im 1/22]
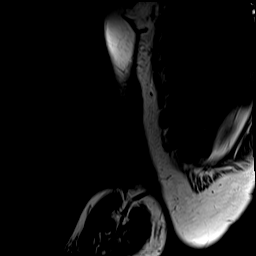
[im 22/22]
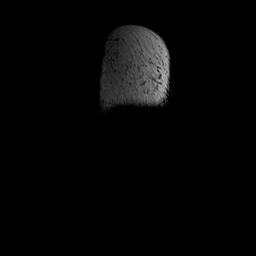

[Series 8: ir_cor · oblique · 4.5mm · 1.48mm/px · 2 of 22 slices shown]
[im 1/22]
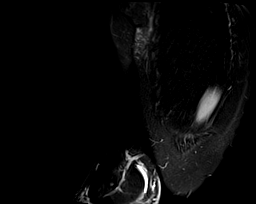
[im 22/22]
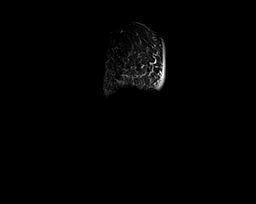

[Series 9: t1_cor · coronal · 4.0mm · 1.17mm/px · 3 of 25 slices shown (2 of 2)]
[im 1/25]
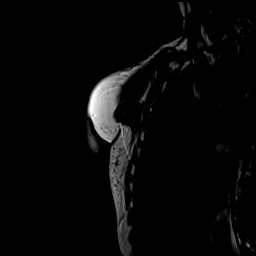
[im 13/25]
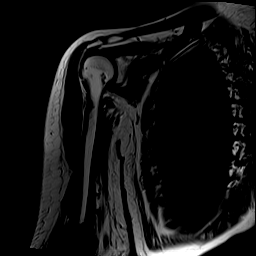
[im 25/25]
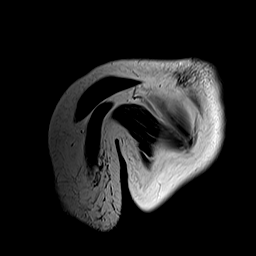

[Series 10: t2_cor_fs · coronal · 4.0mm · 1.17mm/px · 3 of 25 slices shown]
[im 1/25]
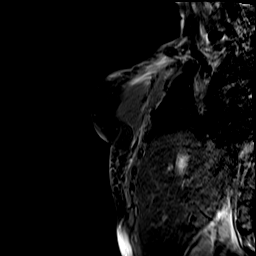
[im 13/25]
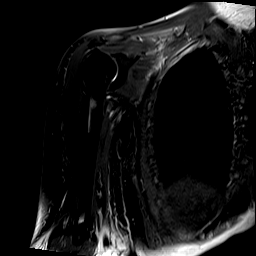
[im 25/25]
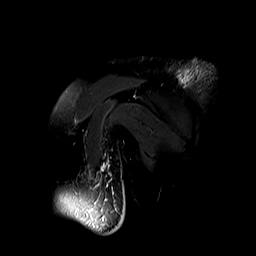

[Series 11: t1_axial · axial · 5.0mm · 0.94mm/px · z∈[-113,+132]mm · 5 of 42 slices shown (2 of 2)]
[im 1/42]
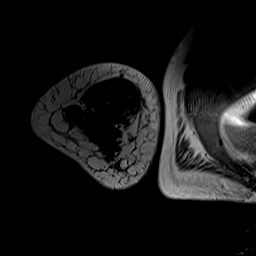
[im 11/42]
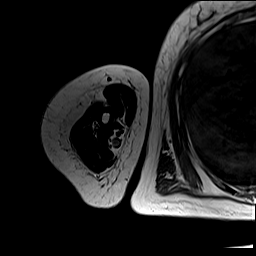
[im 21/42]
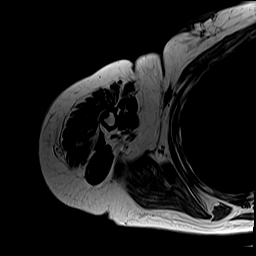
[im 31/42]
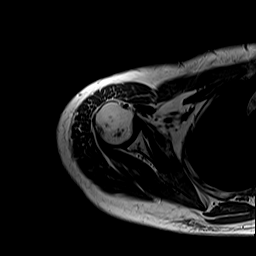
[im 42/42]
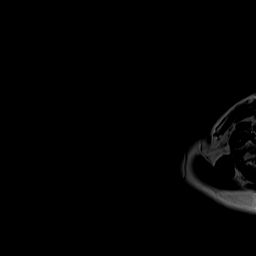

[Series 12: t2_axial_fs · axial · 5.0mm · 0.94mm/px · z∈[-113,+132]mm · 5 of 42 slices shown (2 of 2)]
[im 1/42]
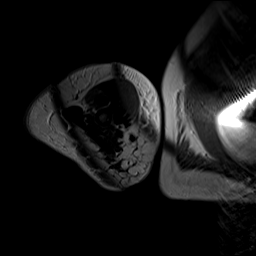
[im 11/42]
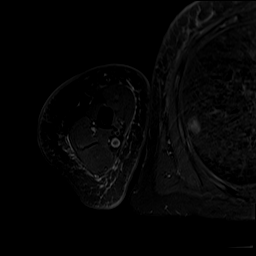
[im 21/42]
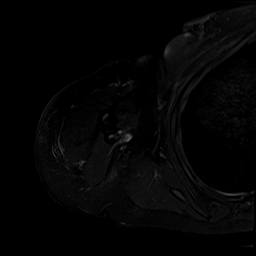
[im 31/42]
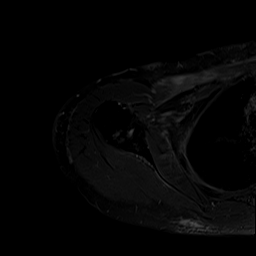
[im 42/42]
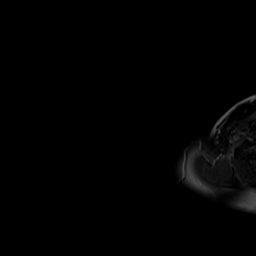

[Series 13: t1_sag · sagittal · 4.0mm · 1.17mm/px · 3 of 26 slices shown (2 of 2)]
[im 1/26]
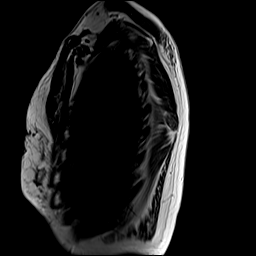
[im 13/26]
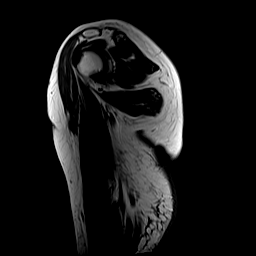
[im 26/26]
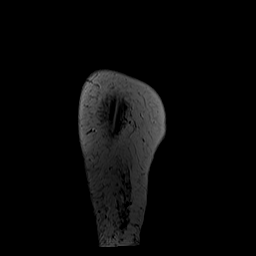

[Series 14: t2_sag_fs · sagittal · 4.0mm · 1.17mm/px · 3 of 26 slices shown (2 of 2)]
[im 1/26]
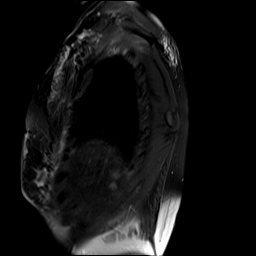
[im 13/26]
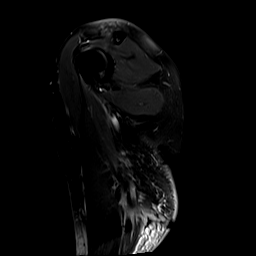
[im 26/26]
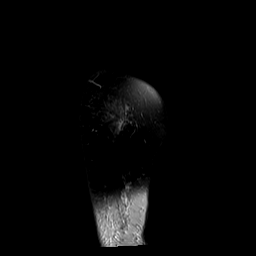

[40 of 40 positions shown; findings below may reference images not displayed]

FINDINGS: A marker is placed overlying the anterior aspect of the right humerus, denoting the patient-directed area of concern.

No suspicious soft tissue mass or fluid collection is identified within the anterolateral aspect of the middle third of the right arm to correspond to the patient-directed area of concern. There is scattered edema-like signal within the subcutaneous soft tissues of the right arm, most pronounced along the distal forearm and posterior medial aspect of the elbow.

No acute fracture is identified. T1 and T2 hypointense structure within the thoracic spine likely represents sclerotic metastases. A 5.7 x 0.8 cm lesion is present within the medullary canal of the distal humerus, most compatible with metastatic disease. An additional 1.9 cm osseous lesion is noted along the radial tuberosity, also likely representing osseous metastatic disease. Hematopoietic red marrow versus osseous metastasis within the proximal humeral diaphysis. A 14 x 12 mm region of T1 and T2 signal hypointensity is present within the body of the scapula (series 10, image 19 and series 9, image 6), concerning for additional osseous metastasis. Osseous metastasis is also noted within the tip of the inferior pole of the scapula. These findings correspond to sclerotic lesions on prior CT.

Postsurgical changes of the right breast are noted. Increased T2 signal is noted within the pectoralis major and minor muscles with adjacent soft tissue edema, likely representing post-treatment-related changes. Two masses are noted within the right hepatic lobe most compatible with metastatic disease, as described on prior CT of the abdomen and pelvis.
IMPRESSION: 1. No suspicious soft tissue mass or fluid collection is identified within the right arm to correspond to the patient-directed area of concern. Edema-like signal is present within the subcutaneous soft tissues of the arm, most pronounced about the lateral aspect of the elbow. This is nonspecific and could represent bland edema, cellulitis, or lymphedema.

2. Multiple osseous metastatic disease of the visualized axial and appendicular skeleton, as described above.

3. Partial visualization of hepatic masses, most compatible with metastatic disease.

## 2021-07-31 IMAGING — MR MRI AXILLA WO CONTRAST
7 series · 16 of 16 positions shown · non-contrast
Comparison: CT of the chest, abdomen and pelvis, 05/24/2021.

INDICATION: Lymphedema
TECHNIQUE: Multiplanar multisequence imaging of the right axilla was performed without contrast.

[Series 10: t1_axial · axial · 5.0mm · 0.75mm/px · z∈[-51,+124]mm · 3 of 33 slices shown]
[im 1/33]
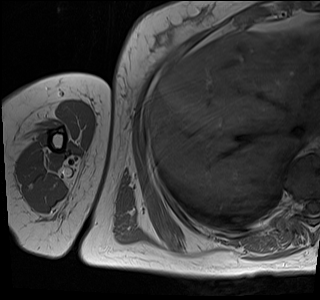
[im 17/33]
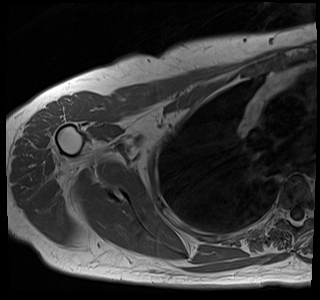
[im 33/33]
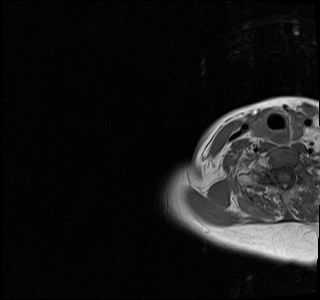

[Series 11: t2_axial_fs · axial · 5.0mm · 0.94mm/px · z∈[-51,+124]mm · 2 of 33 slices shown]
[im 1/33]
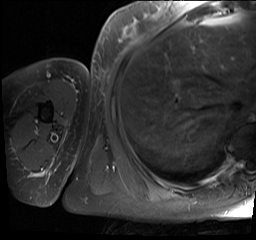
[im 33/33]
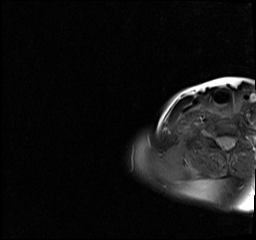

[Series 12: t1_cor · coronal · 5.0mm · 1.17mm/px · 1 of 24 slices shown]
[im 1/24]
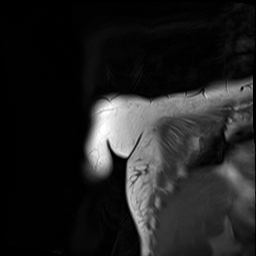

[Series 13: t2_cor_fs · coronal · 5.0mm · 1.17mm/px · 1 of 24 slices shown]
[im 1/24]
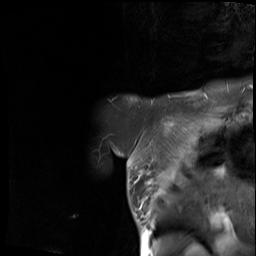

[Series 14: t1_vibe_(person_name)_axial_fs_in · axial · 3.5mm · 0.51mm/px · z∈[-65,+135]mm · 3 of 60 slices shown]
[im 1/60]
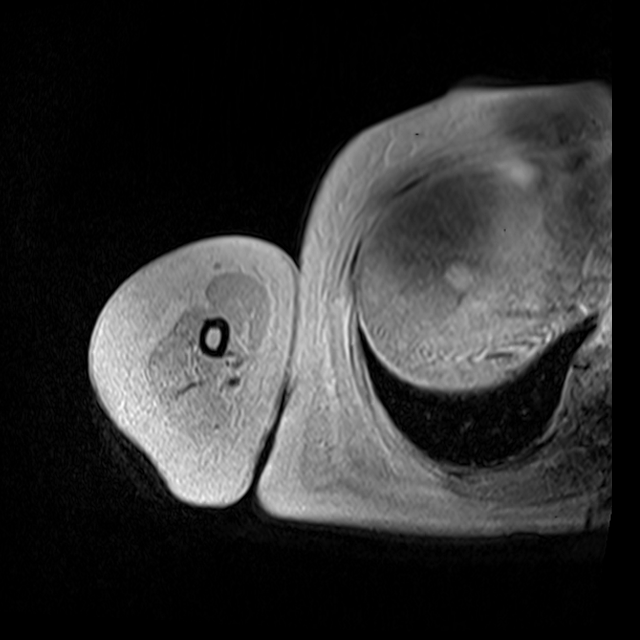
[im 30/60]
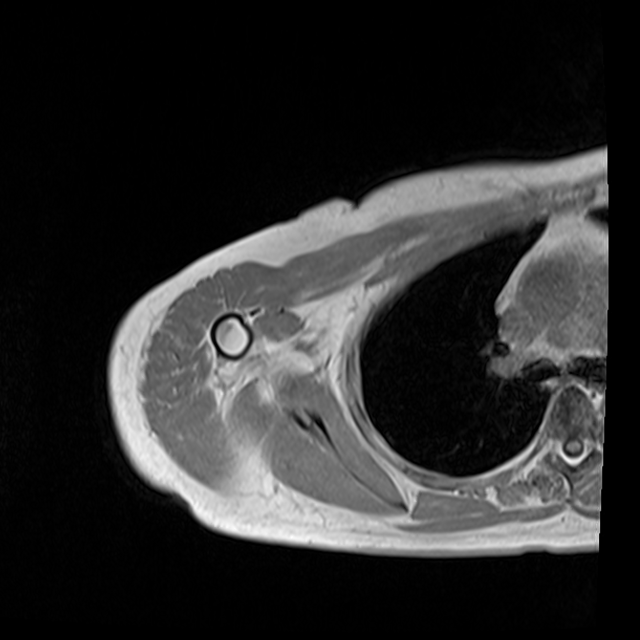
[im 60/60]
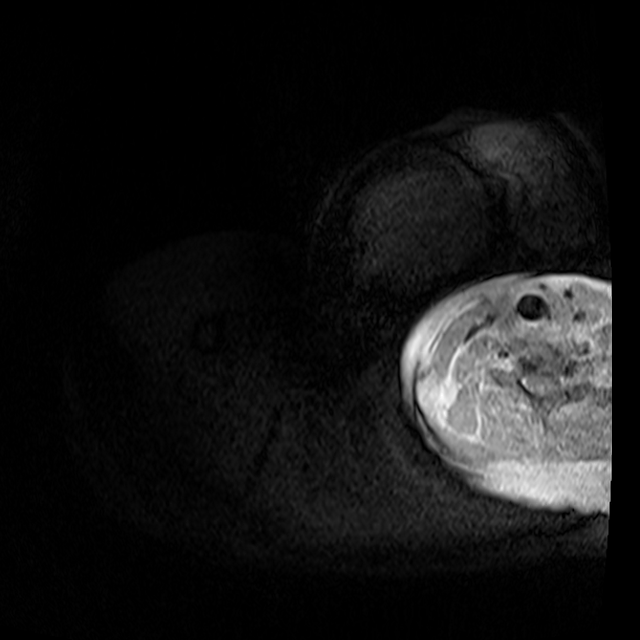

[Series 15: t1_vibe_(person_name)_axial_fs_opp · axial · 3.5mm · 0.51mm/px · z∈[-65,+135]mm · 3 of 60 slices shown]
[im 1/60]
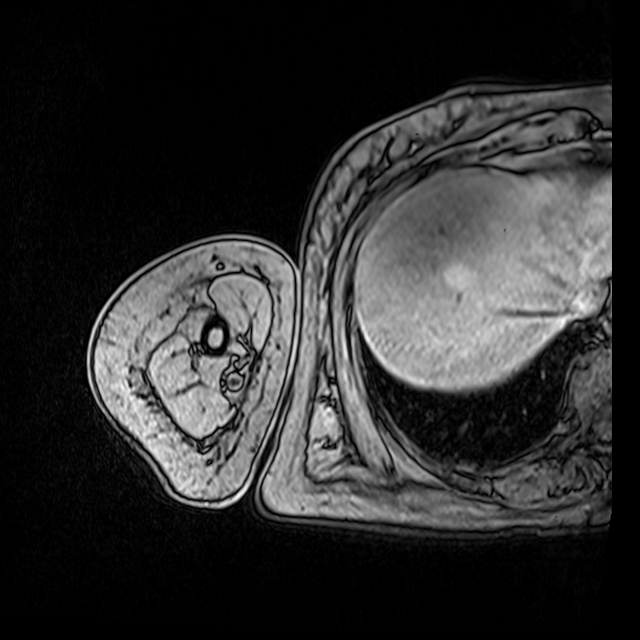
[im 30/60]
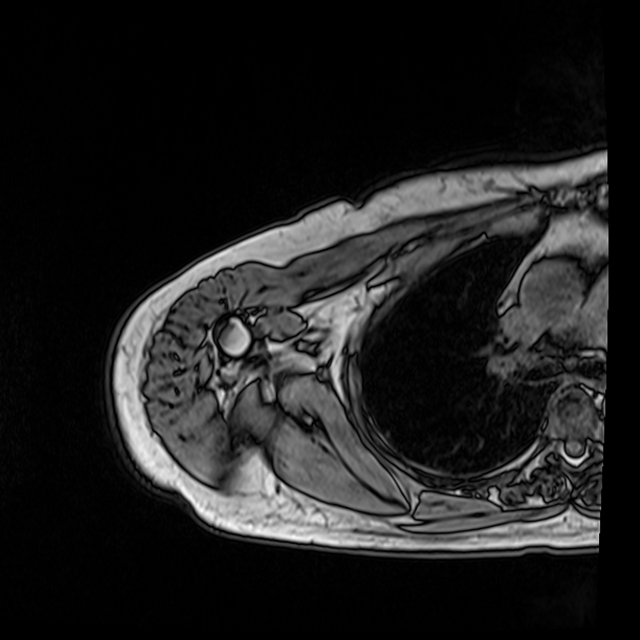
[im 60/60]
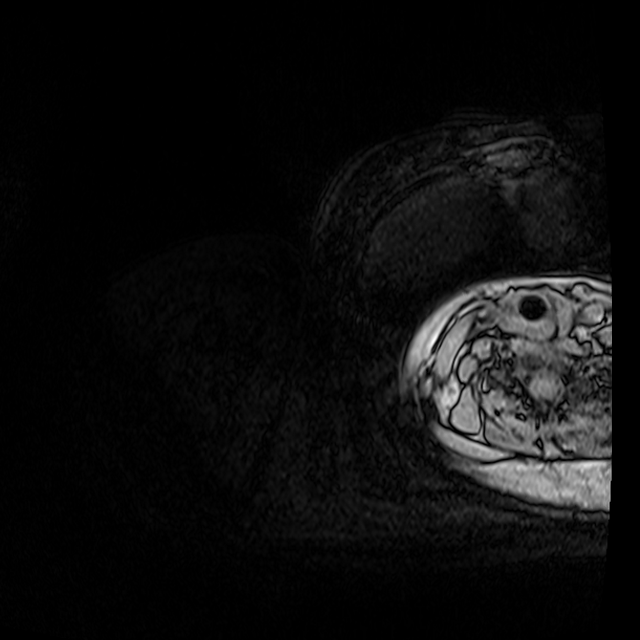

[Series 17: t1_vibe_(person_name)_axial_fs_w · axial · 3.5mm · 0.51mm/px · z∈[-65,+135]mm · 3 of 60 slices shown]
[im 1/60]
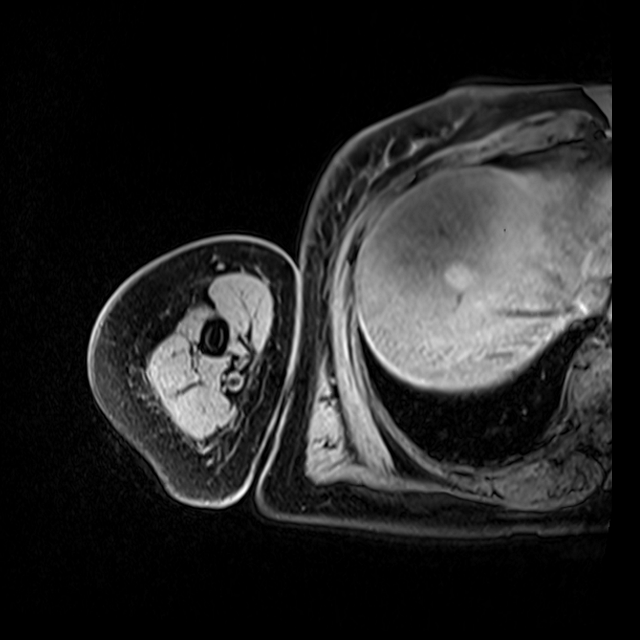
[im 30/60]
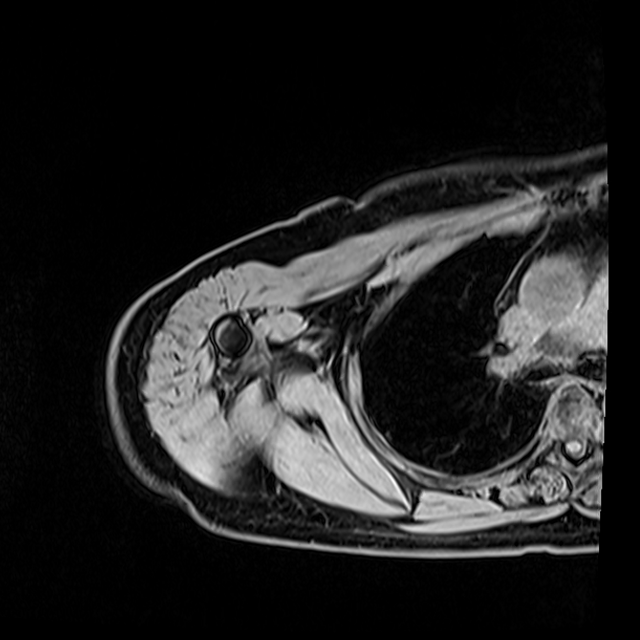
[im 60/60]
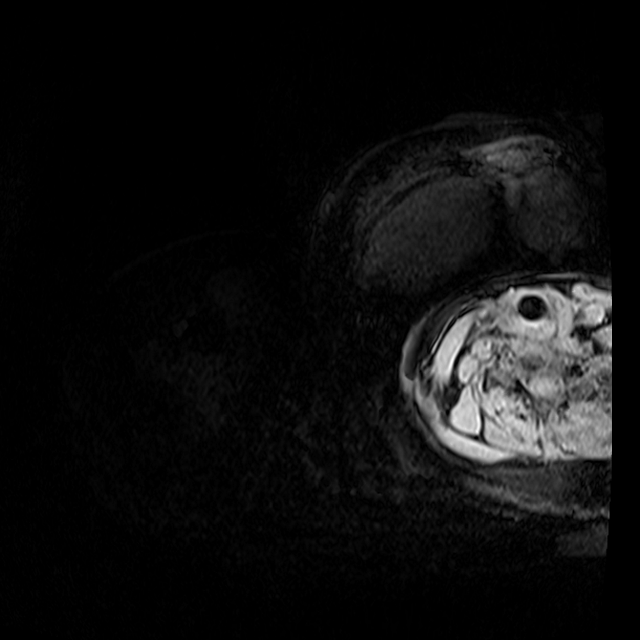

[16 of 16 positions shown; findings below may reference images not displayed]

FINDINGS: No acute fracture is identified. Scattered T1 and T2 hypointense lesions within the visualized portion of the thoracic spine and right scapula, compatible with osseous metastatic disease. Ill-defined T1 signal hypointensity and STIR signal hyperintensity within the proximal humeral diaphysis, nonspecific, but concerning for metastatic disease; however, this could represent hematopoietic red marrow.

Postsurgical changes of the right breast. No axillary adenopathy. Scattered edema within the anterior right chest wall muscles most pronounced in the pectoralis major, favored posttreatment related changes.

Partial visualization of a lesion within the liver measuring 2 cm, likely representing metastatic disease.
IMPRESSION: Postsurgical changes of the right breast, with increased signal in the right anterior chest wall musculature, favored to represent posttreatment related changes. No adenopathy within the visualized portion of the right axilla.

Scattered osseous metastatic disease, as described above.

## 2021-09-07 IMAGING — PT PET CT SKULL BASE TO THIGH_RESTAGING
1 of 5 series · 1 of 25 positions shown · non-contrast
Comparison: [HOSPITAL] CT of the chest, abdomen, and pelvis dated 05/24/2021 and whole body bone scan from 05/24/2021.

Height: 64 inches. Weight: 142.0 pounds.
INDICATION: Previous right breast infiltrating ductal carcinoma diagnosed in 5995, treated with lumpectomy and XRT. Patient had metastatic bone disease of the sacrum diagnosed by biopsy in 9449. Bilateral lung metastases from breast cancer are suspected by CT of the chest, abdomen, and pelvis in 0209, as well as metastasis to the liver in [DATE] by PET/CT. History is notable for tonsillectomy, appendectomy, hernia repair, and prior left breast surgery. Left kidney is not visualized with prior imaging and may be congenitally absent as there is no history of nephrectomy. Most recent chemotherapy was 2 weeks ago. Last radiation dose was in 5995. Last dose of Neulasta was 08/26/2021. Surgical changes seen in the right breast and numerous surgical clips are noted in the right axilla. Left chest Mediport is present.
TECHNIQUE: The patient's serum glucose was 117 mg/dL at time of the study.  The patient was intravenously injected with 8.87 mCi F-18 Fluorodeoxyglucose in a left antecubital fossa vein.  The patient rested quietly for 67 minutes and then received attenuation corrected PET/CT imaging with Time of Flight Protocol from the skull base to mid thigh. Delayed PET/CT imaging of the pelvis is acquired. PET, CT, and fused images were reviewed at the reading station. SUV is corrected based on lean body mass. Images are adequate for review.

[Series 4: ct pet 4.0 br38 · axial · 4.0mm · 0.98mm/px · 1 of 278 slices shown]
[im 278/278  brain]
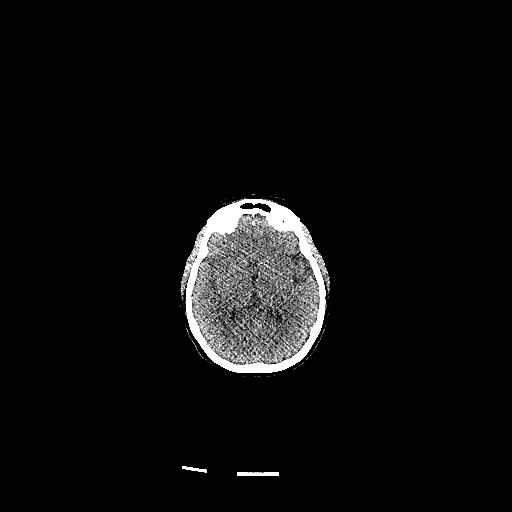

[1 of 25 positions shown; findings below may reference images not displayed]

[facility] [place] from 9449 is currently unavailable for review.
FINDINGS: HEAD/NECK: 

PET: There is normal distribution of the radiopharmaceutical.

CT:  Right nasal septal deviation is seen. Soft tissues of the head and neck otherwise appear within normal limits. Intracranial contents are unremarkable. Mild cervical spine arthritis is incidentally noted.

THORAX: 

PET: The background mediastinal maximum SUV is 1.7. There is normal distribution of the radiopharmaceutical.

CT: Anterior medial left base lung nodule has a diameter 7 mm, previously described as 13 mm in [DATE]. Posterior subpleural left lower lung nodule has a diameter of 5 mm, previously described as 6 mm. Neither of these nodules have increased FDG activity and are inactive. Surgical changes from left breast surgery are noted. Large area of calcification.

ABDOMEN/PELVIS:

PET:  The background liver mean SUV is 1.7. PERCIST Threshold is 2.8. Anterior subcapsular metastasis in the right liver lobe on CT image 127 has greatest diameter of 19 mm and maximum SUV of 4.0. Lateral subcapsular metastasis of the right liver lobe is seen on CT image 120 with a diameter of 9 mm and maximum SUV of 3.1. Many previously identified smaller liver metastases have no current FDG activity showing interval response to therapy. Right liver dome lesion had a diameter of 34 mm and currently is a hypodense area with a diameter of 12 mm and no increased FDG activity at this site.

No other convincing malignant FDG activity is seen within the soft tissues of the abdomen and pelvis. Subcutaneous increased FDG activity at the posterior buttocks appears to be sites of recent medication injection.

CT: Left kidney is not identified, unchanged. Scattered small parametrial phleboliths are noted. Calcification is seen in the anterior left pelvic soft tissues abutting bowel and may represent fecalith. Remaining soft tissues of the abdomen and pelvis appear within normal limits. Periumbilical mesh hernia repair, stable.

SKELETAL:

PET: Increased bone marrow activity is seen in numerous sclerotic foci within the cervical, thoracic, and lumbar spine and pelvis with no increased FDG activity and are thought to be previously treated metastases to bone. There is some increased bone marrow activity within the remaining skeleton that is felt to be secondary to recent Neulasta dosage. This increases the difficulty of evaluation for current active malignant bone disease.

CT: Enlarged articulating posterior spinous processes in the lumbar spine and posterior facet hypertrophy of the lumbar spine, as well as osteoarthritis at joints of the shoulders and pelvis is similar to prior CT chest, abdomen, and pelvis.
IMPRESSION: 1. Today's PET/CT is compared to [HOSPITAL] CT of the chest, abdomen, and pelvis dated 05/24/2021 and whole body bone scan from 05/24/2021. [facility] [place] from 9449 is currently unavailable for review. Persistent hypermetabolic malignant disease is seen within the right liver lobe (two foci) and resolution of numerous additional foci within the right and left liver lobes on this exam. Numerous sclerotic osseous lesions are seen without current FDG activity, thought to be previously treated metastases. Bone marrow hyperplasia due to recent Neulasta increases the difficulty for evaluation of active osseous metastases. Lung nodules at the left lower lung are stable without increased FDG activity and appear benign. No malignant disease is seen within the right breast or right axilla. Overall PET/CT findings are consistent with partial metabolic response to therapy, specifically with regard to liver lesions.

2.  Inflammatory injection site activity at the subcutaneous fat of the posterior buttocks bilaterally. Left kidney remains nonvisualized. Left chest Mediport and surgical changes in the right breast and right axilla are stable.

3. Surgical absence of the tonsils and appendix and previous umbilical hernia repair.

ABNORMAL FINDINGS!

PERCIST reporting definitions and therapy response criteria, please click link below.

[URL]

## 2021-12-07 IMAGING — CT PET CT SKULL BASE TO THIGH_RESTAGING
1 of 3 series · 1 of 25 positions shown · non-contrast
Comparison: PET scan, 09/07/21 and CT of chest abdomen and pelvis 05/24/21

Height: 64 inches. Weight: 147.0 pounds.
INDICATION: Restaging of carcinoma right breast; chemotherapy last given 2 weeks ago.
TECHNIQUE: The patient's serum glucose was 113 mg/dL at time of the study.  The patient was intravenously injected with 8.8 mCi F-18 Fluorodeoxyglucose in a left antecubital vein.  The patient rested quietly for 70 minutes and then received attenuation corrected PET/CT imaging with Time of Flight Protocol from the skull base to mid thigh. PET, CT, and fused images were reviewed at the reading station. SUV is corrected based on lean body mass. Images are adequate for review.

[Series 3: ct pet 4.0 br38 · axial · 4.0mm · 0.98mm/px · 1 of 278 slices shown]
[im 278/278  brain]
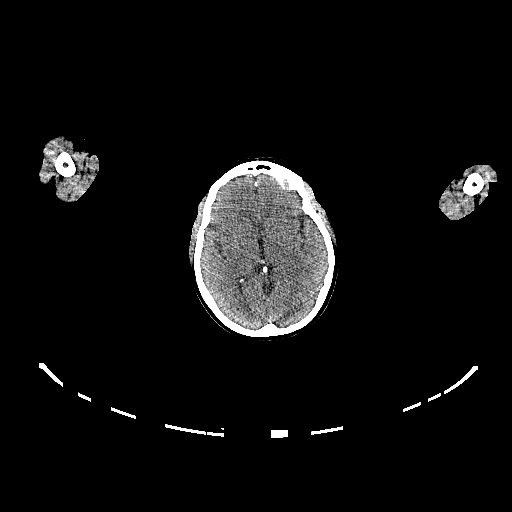

[1 of 25 positions shown; findings below may reference images not displayed]

FINDINGS: HEAD/NECK: 

PET: The caudal margin of brain is normal. Normal uptake of radiotracer is seen in the head and neck region with no evidence of primary or metastatic malignancy.

CT:  Caudal margin of brain is normal. Orbits and paranasal sinuses are normal. Normal soft tissue, airway and thoracic inlet neck anatomy is present. Left IJ Mediport catheter is in good position.

THORAX: 

PET: The background mediastinal maximum SUV is 2.0. There is normal uptake of radiotracer in the chest wall, axillary regions and mediastinum. No abnormal pulmonary uptake of radiotracer is present on today's study.

CT: The chest wall, axillary regions and mediastinum are normal. Hypoventilatory changes are seen in the lung bases, with stable pulmonary nodules in the left lower lobe on image 97 and 95, each measuring 6 mm in diameter unchanged from 09/07/21 with significant interval decrease in size of one of the 2 nodules compared to earlier study of 05/24/21. Neither nodule is FDG avid above background.

ABDOMEN/PELVIS:

PET:  The background liver mean SUV is  1.9. PERCIST Threshold is 2.7. Previous CT of 05/24/21 showed widespread hepatic metastases in liver. The largest was located in the central upper right lobe and shows no FDG avid uptake above background today.

Focal mildly increased uptake in segment IVb of liver is seen on image 119 with standard uptake of 3.6 and was previously 4.0 which on CT measures approximately 18 x 11 mm and on previous PET scan measured 23 x 18 mm.

Second focal region of increased uptake in segment VII of liver is seen on image 114 with standard uptake of 3.3 and was previously 3.1 and cannot be seen on corresponding CT exam. No new sites of abnormal FDG uptake are present in the liver.

There is normal uptake of radiotracer elsewhere in the abdomen and pelvis with low level bowel activity similar to prior exam. Normal pelvic floor uptake is present.

CT: No additional hepatic findings are seen. It is quite clear that there has been substantial regression of metastatic liver disease when compared to diagnostic CT of 05/24/21 but individual liver lesions are difficult to visualize without IV contrast.

The gallbladder and biliary system, spleen, pancreas, adrenal glands and right kidney are normal. There are postoperative changes of left nephrectomy. Periaortic and paracaval spaces and intraperitoneal compartment with repair of supraumbilical anterior abdominal wall hernia with mesh without recurrent hernia and pelvic floor are normal. No inguinal findings are present.

SKELETAL:

PET: There again is widespread increased uptake of radiotracer diffusely in the skeletal system which in part is due to recent cessation of chemotherapy with bone marrow stimulation. It is however quite apparent that there are focal regions of low level increased uptake above background activity representing superimposed skeletal metastatic disease. Very little increased uptake at sites of metastases are identified. Representative metastasis in the anterior T7 vertebral body shows standard uptake of 4.5, and was previously 5.1 on 09/07/21 exam. No additional findings are present.

CT: Widespread osteosclerotic bone metastases are present in the skeletal system from the proximal femur to the cervical spine and when compared to previous PET scan of 09/07/21 show no significant interval change. When compared to older CT of 05/24/21, greater bone demineralization at sites of metastases have developed indicating healing response to therapy. No associated fracture is seen in the spine.
IMPRESSION: 1. Today's PET/CT is compared to PET scan of 09/07/21 and CT of chest, abdomen and pelvis of 05/24/21. Minimal pulmonary metastases noted on prior exam are no longer seen on PET and have remained stable compared to CT performed with PET on 09/07/21. No new metastatic pulmonary disease is present.

2.  Widespread metastatic liver disease was evident on 05/24/21 exam but only 2 of the liver metastasis are FDG avid and both show only minimal uptake above background with uptake similar to 09/07/21 indicating stable disease compared to PET but significant improvement compared to 05/24/21 CT exam.

3. Extensive widespread osteosclerotic bone metastases are present on CT which are difficult to evaluate once again due to bone marrow stimulation, from recent chemotherapy, but it is believed that only a few of the bone metastases are mildly FDG avid above background and show no significant change compared to prior PET scan of 09/07/21.

[DATE]. Miscellaneous findings as noted above.

PERCIST reporting definitions and therapy response criteria, please click link below.

[URL] 

STAT fax

## 2022-03-25 IMAGING — CT PET CT SKULL BASE TO THIGH_RESTAGING
3 series · 25 of 25 positions shown · non-contrast
Comparison: none

[Series 3: ct pet 4.0 br38 · axial · 4.0mm · 0.98mm/px · z∈[-1059,-102]mm · 11 of 320 slices shown]
[im 1/320]
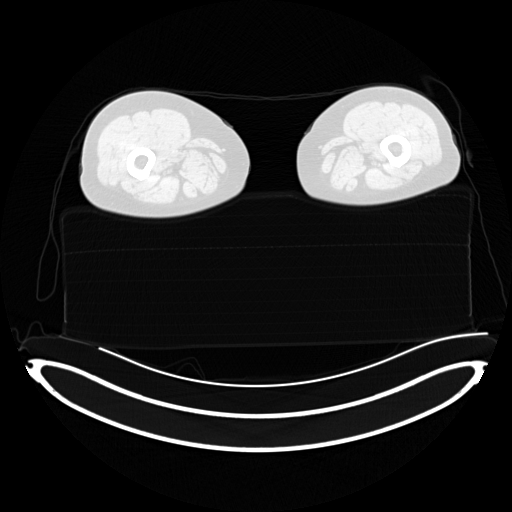
[im 32/320]
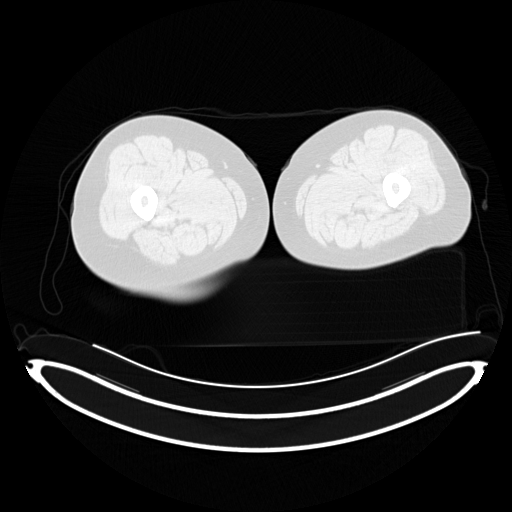
[im 64/320]
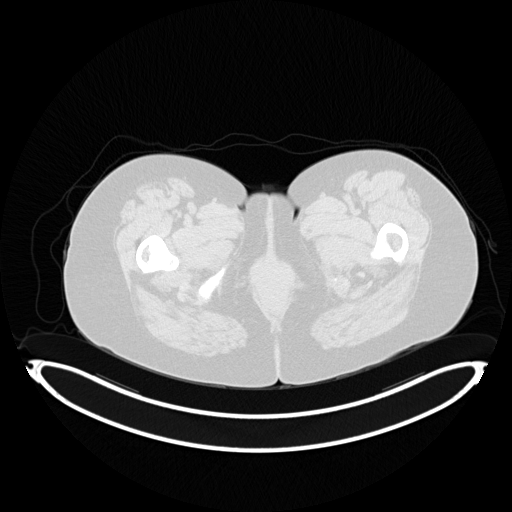
[im 96/320]
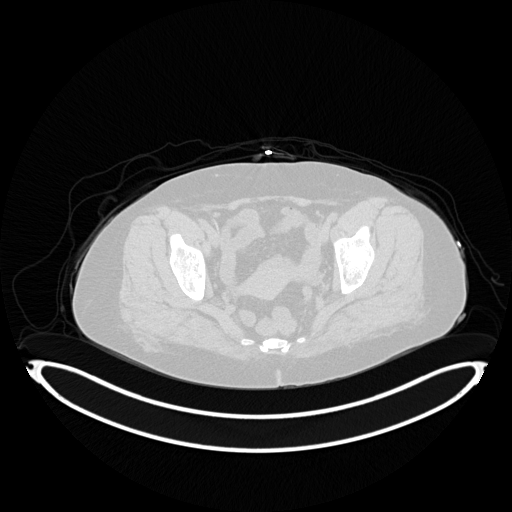
[im 128/320]
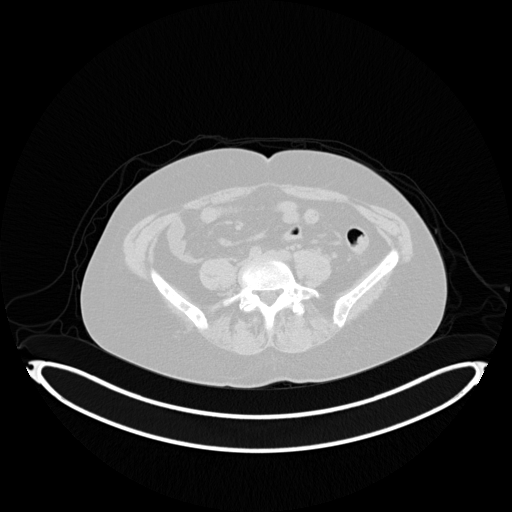
[im 160/320]
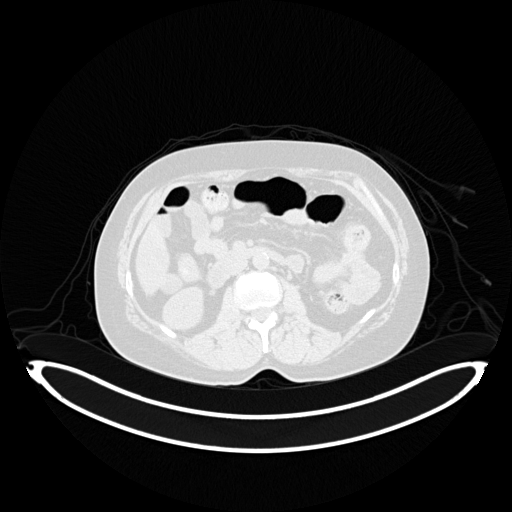
[im 192/320]
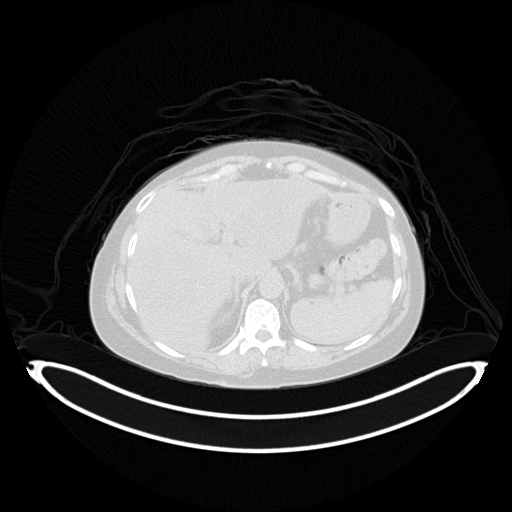
[im 224/320]
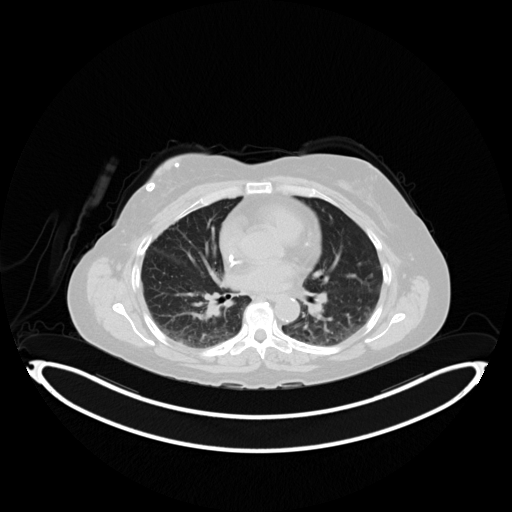
[im 256/320]
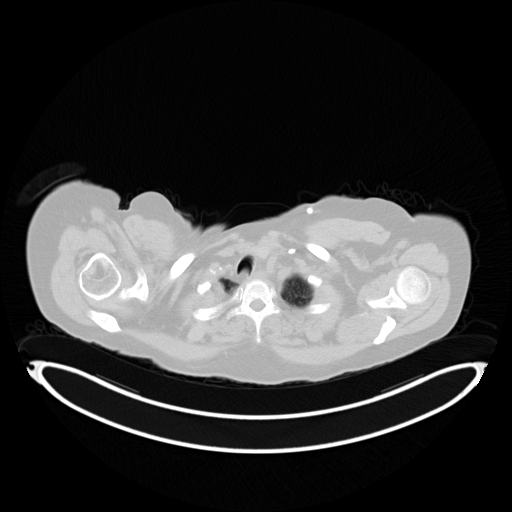
[im 288/320]
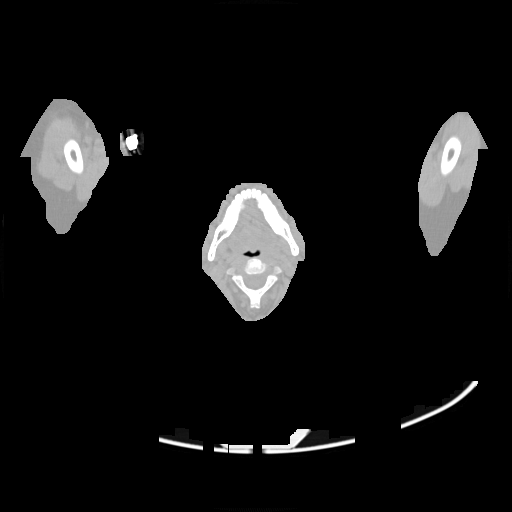
[im 320/320  brain]
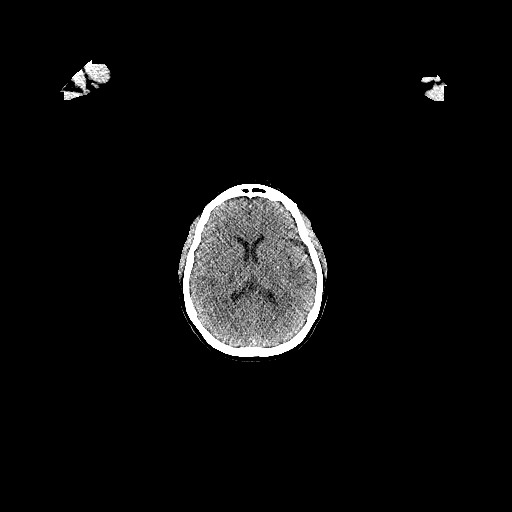

[Series 4: pet ac · axial · 5.0mm · 4.07mm/px · z∈[-1059,-102]mm · 12 of 320 slices shown]
[im 1/320]
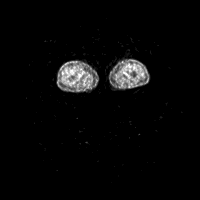
[im 30/320]
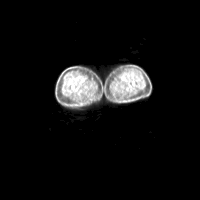
[im 59/320]
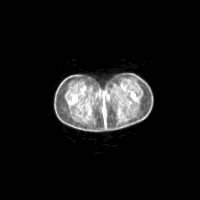
[im 88/320]
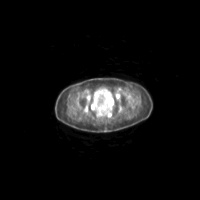
[im 117/320]
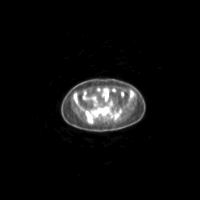
[im 146/320]
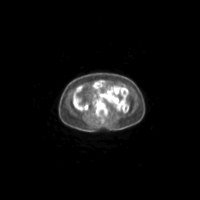
[im 175/320]
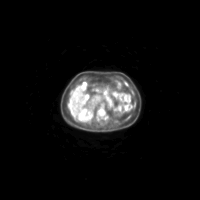
[im 204/320]
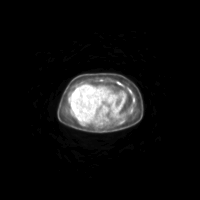
[im 233/320]
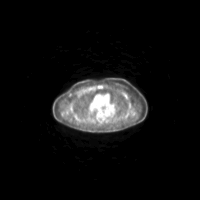
[im 262/320]
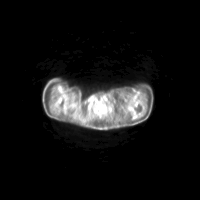
[im 291/320]
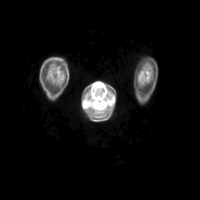
[im 320/320]
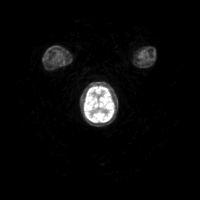

[pet/ct mip movie · axial · 1.0mm · 3.00mm/px · z∈[-24,+24]mm · 2 of 48 slices shown]
[im 1/48]
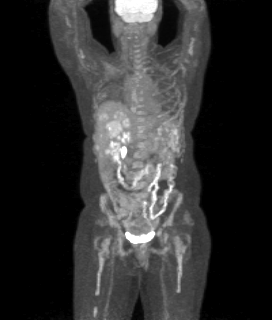
[im 48/48]
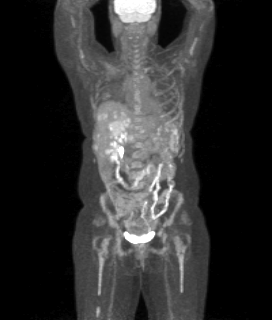

[25 of 25 positions shown; findings below may reference images not displayed]

Images Obtained from [facility]
REASON FOR EXAM
*  Secondary malignant neoplasm of bone
*  Secondary malignant neoplasm of lung
*  Secondary malignant neoplasm of liver
*  Malignant neoplasm of upper-outer quadrant of right female breast
*  Current chemotherapy/immunotherapy/endocrine therapy
COMPARISON
*  PET/CT 12/07/2021
*  PET/CT 09/07/2021
TECHNIQUE
*  Height 64 inches
*  Weight 158 pounds
*  Serum glucose 107 mg/dL
*  11.85  mCi F-18 Fluorodeoxyglucose was administered intravenously in a left upper extremity vein
*  63 minutes rest period after injection
*  Imaging field of view was skull base to mid-thigh
*  PET images were acquired and corrected for attenuation and time of flight
*  CT images were acquired without IV or oral contrast
*  PET, CT, and fused PET/CT images were reviewed at an independent reading station
*  Unless otherwise indicated, the standardized uptake value (SUV) is the maximum single pixel intensity within a region of interest, corrected for lean body mass
FINDINGS
BACKGROUND
*  Previous Liver 1.9  SUVmean
*  Current aorta 1.4 SUVmean
*  PERCIST Threshold 2.3 SUVpeak
METABOLIC FINDINGS
Breasts
*  No malignant uptake
*  In particular, no suspicious activity in the right lumpectomy scar
Lymph Nodes
*  No hypermetabolic lymph nodes
*  Right axillary lymph node sampling
Lungs
*  Nodules remain metabolically inactive
*  Nodules are stable in size and number since 12/07/2021, two examples listed below
*  Left lower lobe, anteromedial basilar segment, solid nodule (104)   0.8 SUV,  0.7 cm   --->   0.7 SUV ,   0.7 cm
*  Left lower lobe, posterior basilar segment, solid nodule (103)   0.8 SUV,  0.6 cm   --->   0.7 SUV ,   0.6 cm
Liver
*  With therapy, the liver is developing progressive geographic steatosis. This makes the hepatic metastases difficult to visualize (and measure) on CT. However, qualitatively, the lesions are
definitely enlarging on PET
*  Number of metabolically active lesions on PET has increased from 2 to 30 since 12/07/2021, a few examples listed below
*  Segment I metastasis (128)   3.0 SUV   --->   4.7 SUV
*  Segment III metastasis is a new finding (142)   3.8 SUV
Bones
*  Background marrow has mildly decreased in activity since 12/07/2021. Differing degrees of marrow stimulation severely inhibit comparison of activity within the osseous metastases. Nevertheless, one
example is provided below
*  T7 vertebral body metastasis (110)   4.5 SUV   --->   3.1 SUV
*  Degree of sclerosis within the treated metastases has not changed since the comparison exam
Additional Activity
*  Upper extremity lymphedema is not conspicuous on today's examination
ANATOMIC FINDINGS
*  Left chest wall Mediport
*  Atrophic left kidney
*  Periumbilical abdominal wall mesh repair
*  Atherosclerotic disease
IMPRESSION
*  Liver metastases have increased in size and number (and to a lesser extent, FDG uptake) since 12/07/2021
*  Treated pulmonary metastases remain metabolically inactive and stable
*  Differing degrees of marrow stimulation limit comparison of activity within the osseous metastases (probably not changed since 12/07/2021)

## 2022-05-30 IMAGING — PT PET CT SKULL BASE TO THIGH_RESTAGING
3 series · 25 of 25 positions shown · non-contrast
Comparison: 03/25/22 and 12/07/2021

Images Obtained from [facility]
Height: 64 inches. Weight: 143 pounds.
INDICATION: Restaging of carcinoma of right breast status post right lumpectomy and axillary lymph node dissection with right arm lymphedema.
TECHNIQUE: The patient's serum glucose was 135 mg/dL at time of the study.  The patient was intravenously injected with 10.0 mCi F-18 Fluorodeoxyglucose in a left antecubital vein.  The patient
rested quietly for 54 minutes and then received attenuation corrected PET/CT imaging with Time of Flight Protocol from the skull base to mid thigh. PET, CT, and fused images were reviewed at the
reading station. SUV is corrected based on lean body mass. Images are adequate for review.
The patient received a bone marrow stimulating agent 5 days ago, which resulted in the majority of radiotracer preferentially going to bone marrow. This interferes with accuracy of assessment of
uptake in patients known liver metastases.

[Series 3: pet ac · axial · 5.0mm · 4.07mm/px · z∈[-872,-40]mm · 11 of 278 slices shown]
[im 1/278]
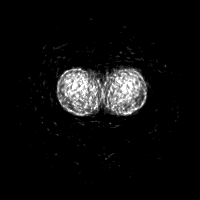
[im 28/278]
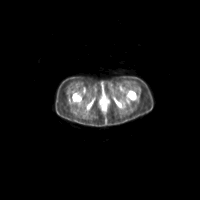
[im 56/278]
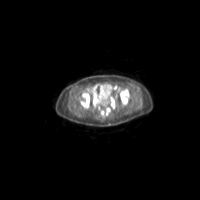
[im 84/278]
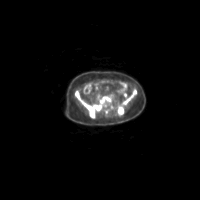
[im 111/278]
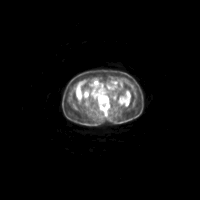
[im 139/278]
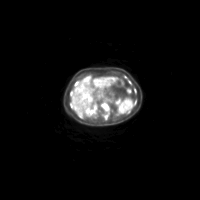
[im 167/278]
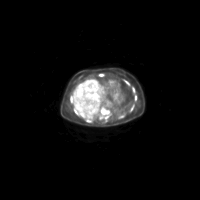
[im 194/278]
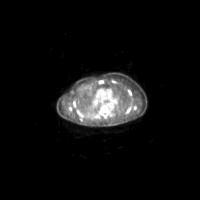
[im 222/278]
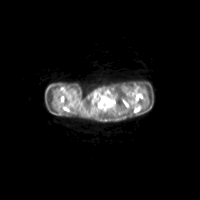
[im 250/278]
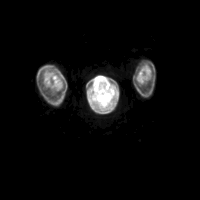
[im 278/278]
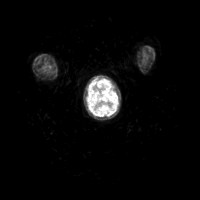

[Series 4: ct pet 4.0 br38 · axial · 4.0mm · 0.98mm/px · z∈[-872,-40]mm · 12 of 278 slices shown]
[im 1/278]
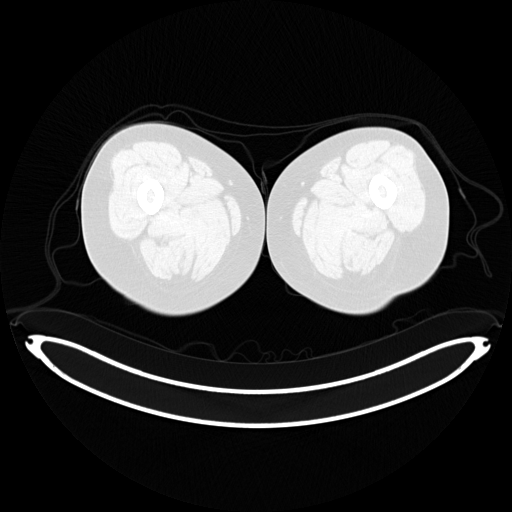
[im 26/278]
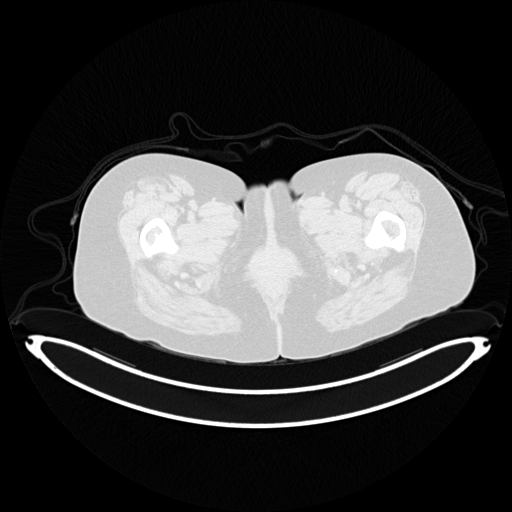
[im 51/278]
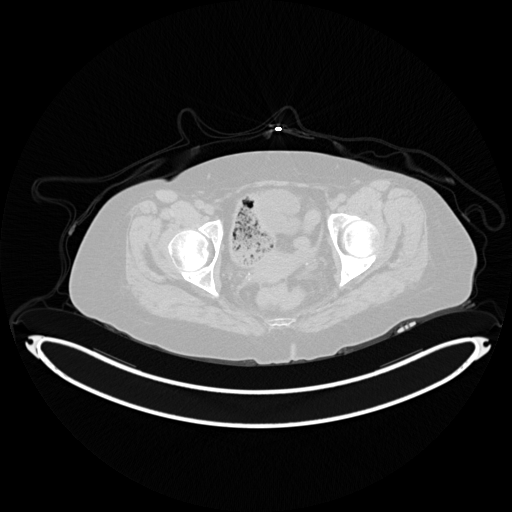
[im 76/278]
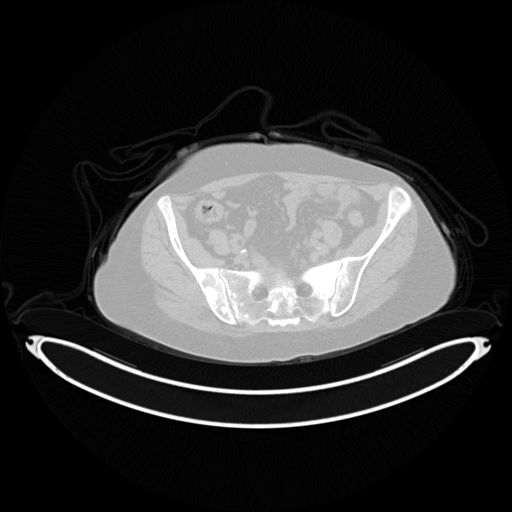
[im 101/278]
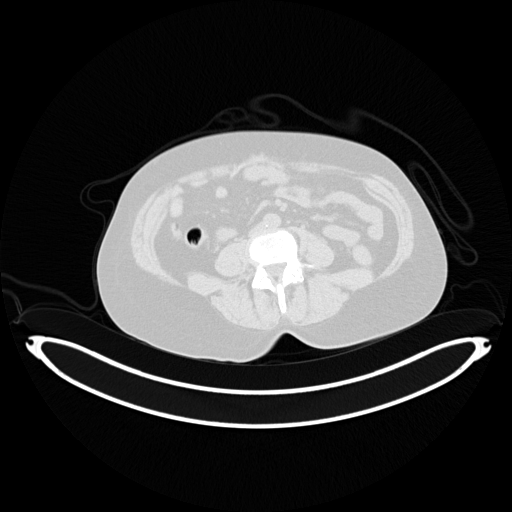
[im 126/278]
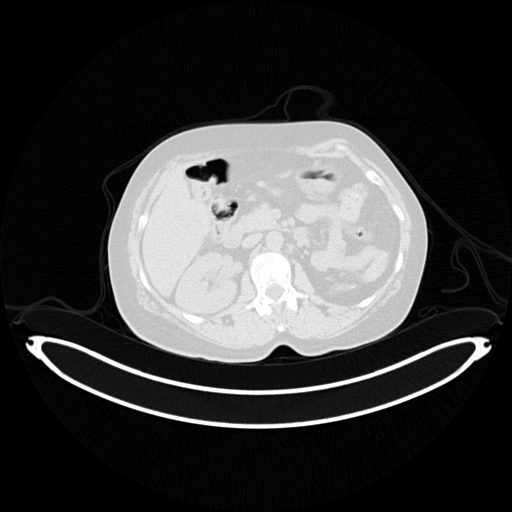
[im 152/278]
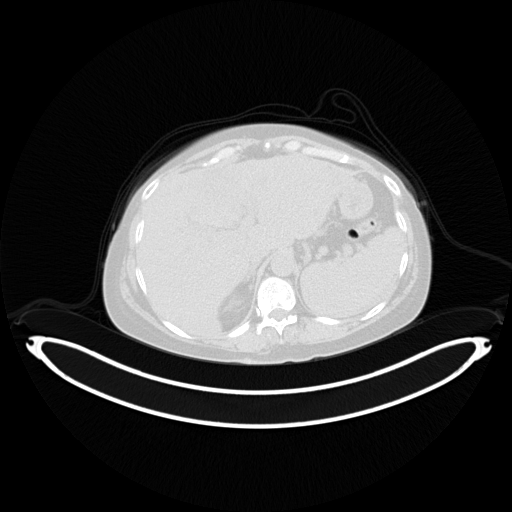
[im 177/278]
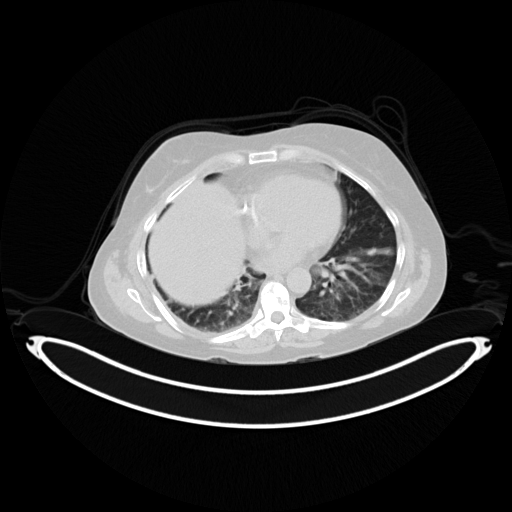
[im 202/278]
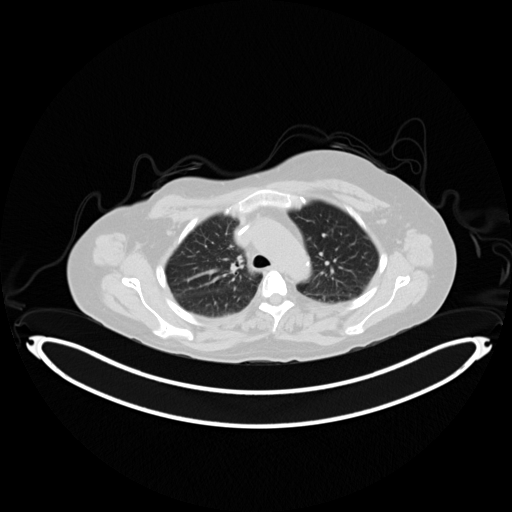
[im 227/278]
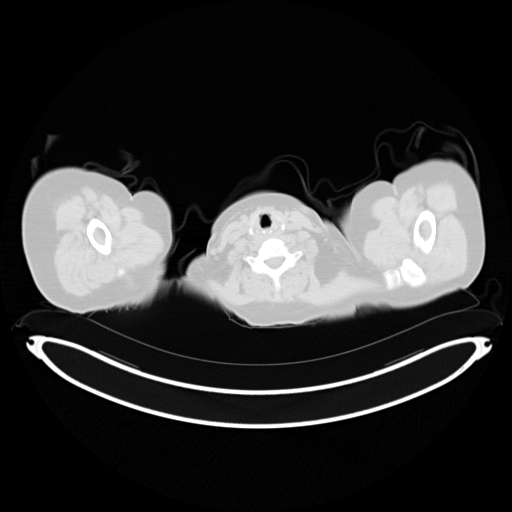
[im 252/278]
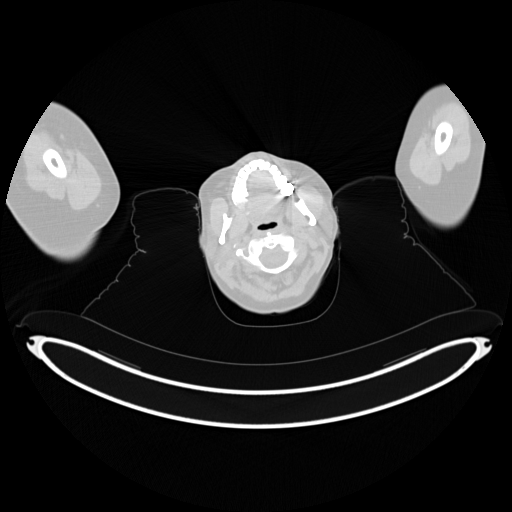
[im 278/278  brain]
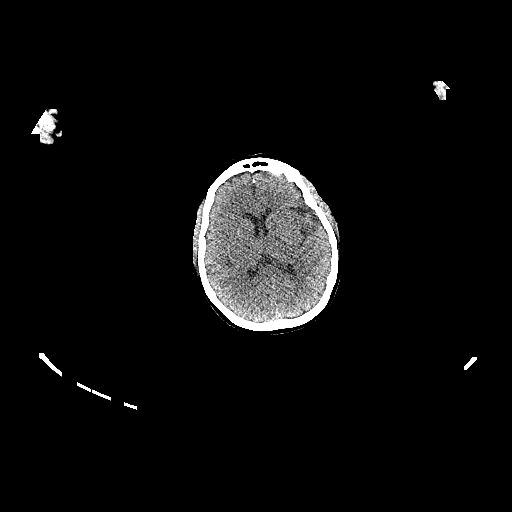

[pet/ct mip movie · axial · 1.0mm · 3.00mm/px · z∈[-24,+24]mm · 2 of 48 slices shown]
[im 1/48]
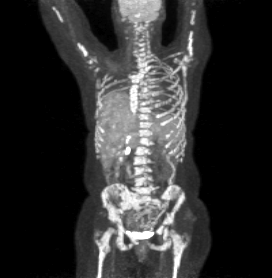
[im 48/48]
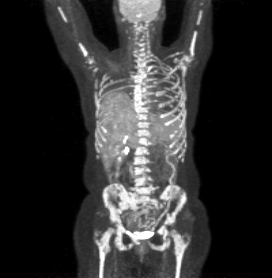

[25 of 25 positions shown; findings below may reference images not displayed]

FINDINGS: HEAD/NECK:
PET: There is normal uptake of radiotracer in the caudal margin of brain and head and neck region with no evidence of primary or metastatic malignancy.
CT:  The caudal margin of brain is normal. Orbits and paranasal sinuses are normal. Normal soft tissue neck anatomy is present. Left IJ Mediport central venous catheter remains in good position.
THORAX:
PET: The background mediastinal maximum SUV is 1.7. There is normal soft tissue uptake of radiotracer in the chest wall, axillary regions and thoracic inlet and mediastinum. No abnormal pulmonary
uptake of radiotracer is seen.
CT: The chest wall, axillary regions and thoracic inlet are normal. No mediastinal findings are present. Heart size is normal and no hiatal hernia is present. Minimal atheromatous vascular
calcifications are seen in the aortic arch and coronary system.
Interval development of bibasal atelectasis with worsening reticular scarring is seen which obscures lung parenchyma. There are numerous small pulmonary nodules in the right and left lung, generally
measuring 2 to 3 mm in diameter; representative nodules are as follows:
1. Right upper lobe ([DATE]) measuring 4 mm on 03/25/22 and 3 mm on 12/07/21.
2. Right middle lobe ([DATE]) measuring 8 x 6 mm and measuring 4 x 4 mm on 03/25/22 and 3 x 3 mm on 12/07/21.
3. Left upper lobe ([DATE]), measuring 5 mm and measured 3.5 mm on 03/25/2022 and 12/07/2021.
4. Superior segment left lower lobe ([DATE]), 6 x 5 mm and measured 5.5 x 4 mm on 03/25/22 and measured 5 x 5 mm on 12/07/21.
In general, the pulmonary nodules appear slightly larger than on recent prior study although no new nodules are seen. None of the nodules are of sufficient size to be seen on PET and are consistent
with slowly progressing pulmonary metastases.
ABDOMEN/PELVIS:
PET:  The background liver mean SUV is 2.3. PERCIST threshold is 2.9. There are numerous hypermetabolic liver metastases in liver which were also seen on previous PET study of 03/25/22. Due to
differences in uptake in the liver, related to preferential radiotracer deposition in the bone, assessment of metabolic activity of liver metastases is compromised. Representative metastases with
comparison to PET scan of 03/25/22 are as follows:
1. Segment Lolo/Julian Enrique, image 129 uptake 6.3, previously 6.8.
2. Segment III, image 140, uptake of 4.6, previously 3.5.
3. Segment III, image 124, uptake of 4.7, previous 4.6.
In general however, uptake in numerous small liver metastases is less apparent than on previous exam suggesting partial response to therapy.
No other sites of abnormal FDG activity are seen in the abdomen or pelvis.
CT: The liver shows multiple hypodense metastases whose margins are difficult to measure on CT and therefore size cannot be used for comparison with previous exams. Gallbladder and biliary system,
spleen, pancreas, adrenal glands and right kidney are normal. Left kidney is surgically absent. The periaortic and paracaval spaces, intraperitoneal compartment status post successful repair of
umbilical hernia with mesh, and pelvic floor are normal.
SKELETAL:
PET: There is diffuse marked increased uptake of radiotracer in the skeletal system related to recent use of bone marrow stimulating agent. Assessment for improvement in bone metastases cannot be
made on PET.
CT: Widespread osteosclerotic bone metastases are present in the proximal right and left femur, right and left acetabular region and upper pelvis, sacrum, especially in the left lateral mass of S1
and in all vertebrae of the lumbar and thoracic spine. Careful comparison of dense osteosclerotic bone metastases with previous exam of 03/25/22 shows no interval change in size or appearance of
individual bone metastases. Findings suggest stable metastatic disease.
IMPRESSION: 1. Today's PET/CT is compared to PET scan of 03/25/2022 and 12/07/2021. Minimal increase in size of some of the individual pulmonary metastases which are too small to be seen on PET, are noted.
2.  Numerous hepatic metastases show slight improvement in FDG activity compared to 03/25/22.
3. PET is not useful for assessment of activity in bone metastases due to use of marrow stimulating agent 5 days prior to PET, but review of CT shows widespread osteosclerotic metastases are
unchanged in appearance from 03/25/22.
4. Overall findings are consistent with stable disease. No significant miscellaneous findings are noted.

## 2022-06-17 IMAGING — CR CHEST 2 VWS PA LAT
2 series · 2 of 2 positions shown · non-contrast
Comparison: CT of chest 05/24/2021. PET/CT 05/30/2022.

Images Obtained from [facility]
HISTORY: Wheezing.
TECHNIQUE: PA and lateral chest radiographs, two views.

[w chest pa]
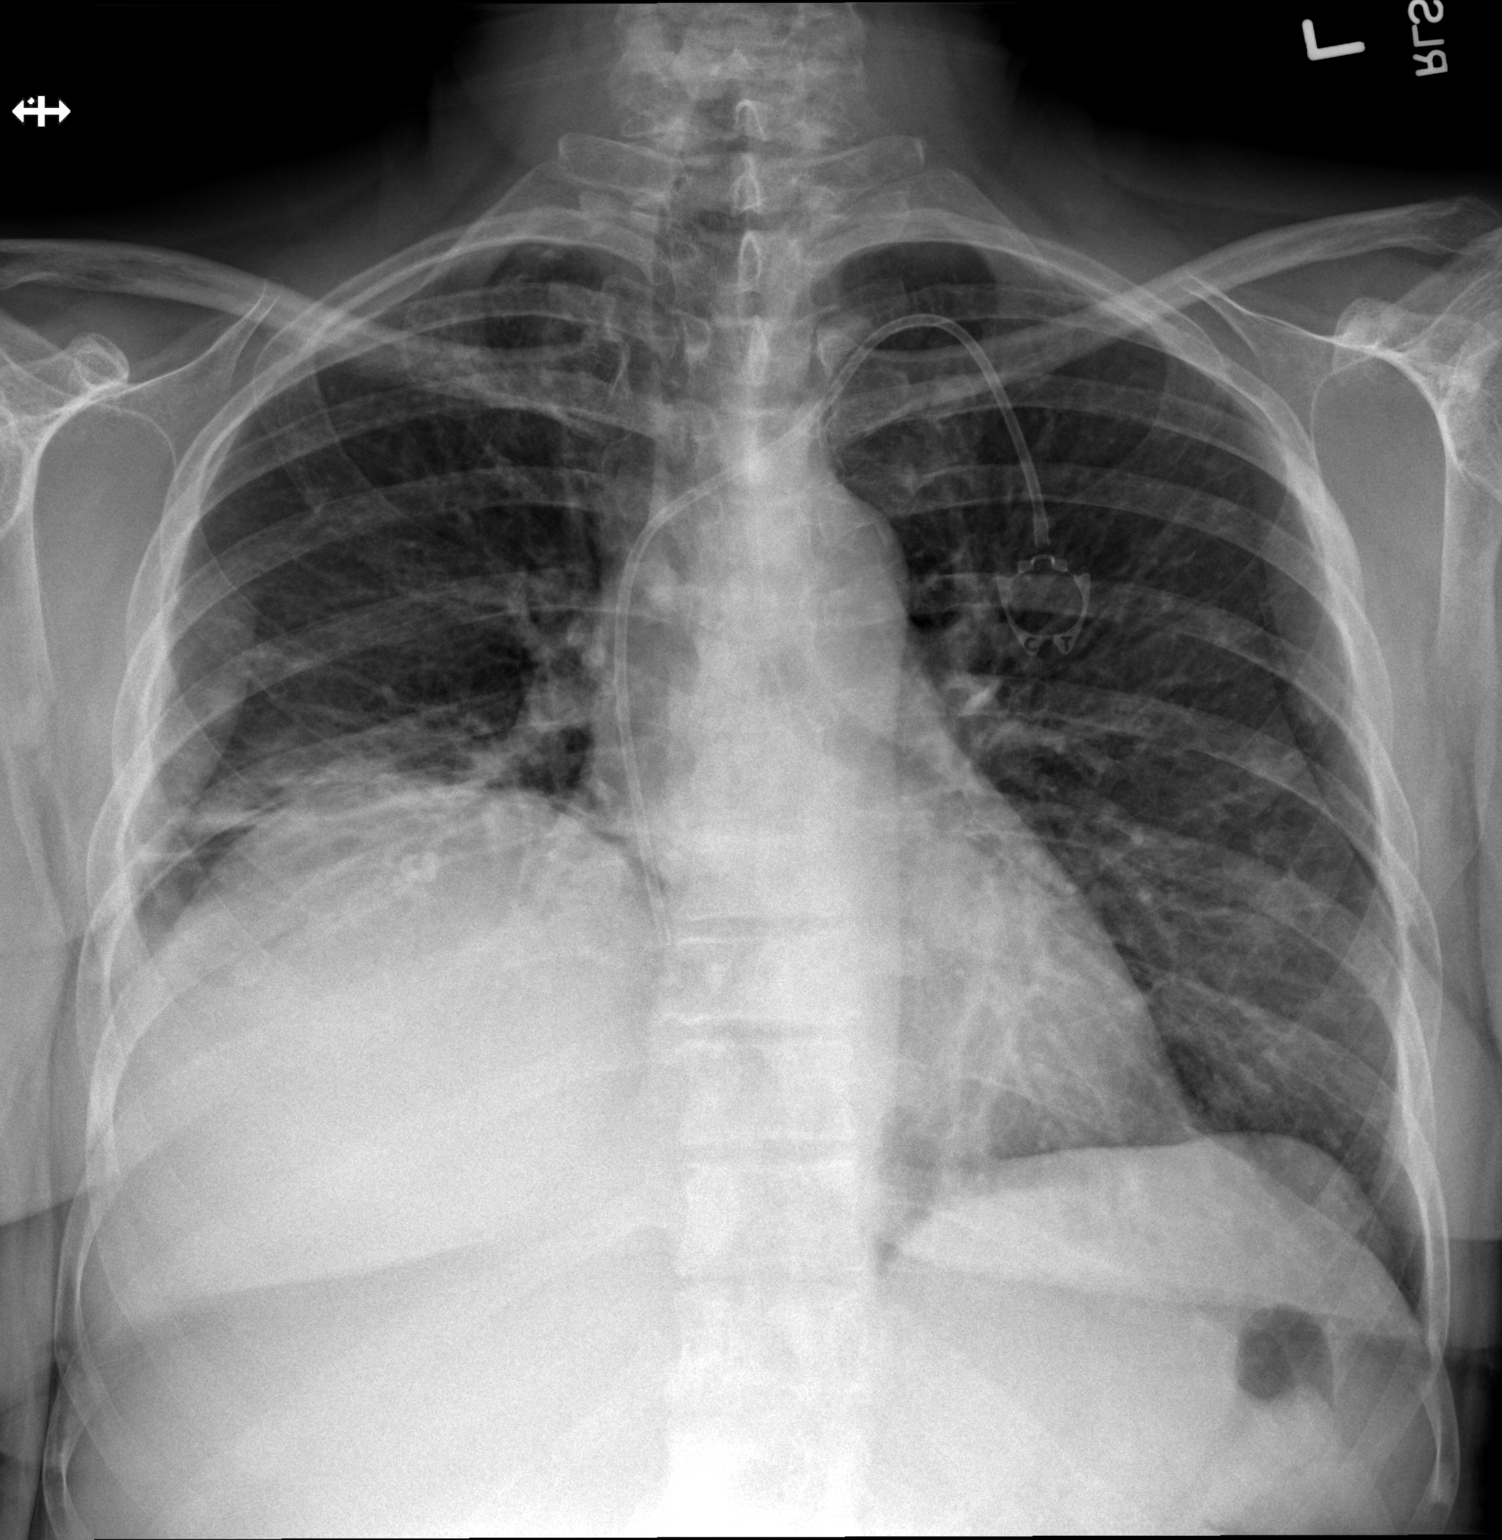

[w chest lat]
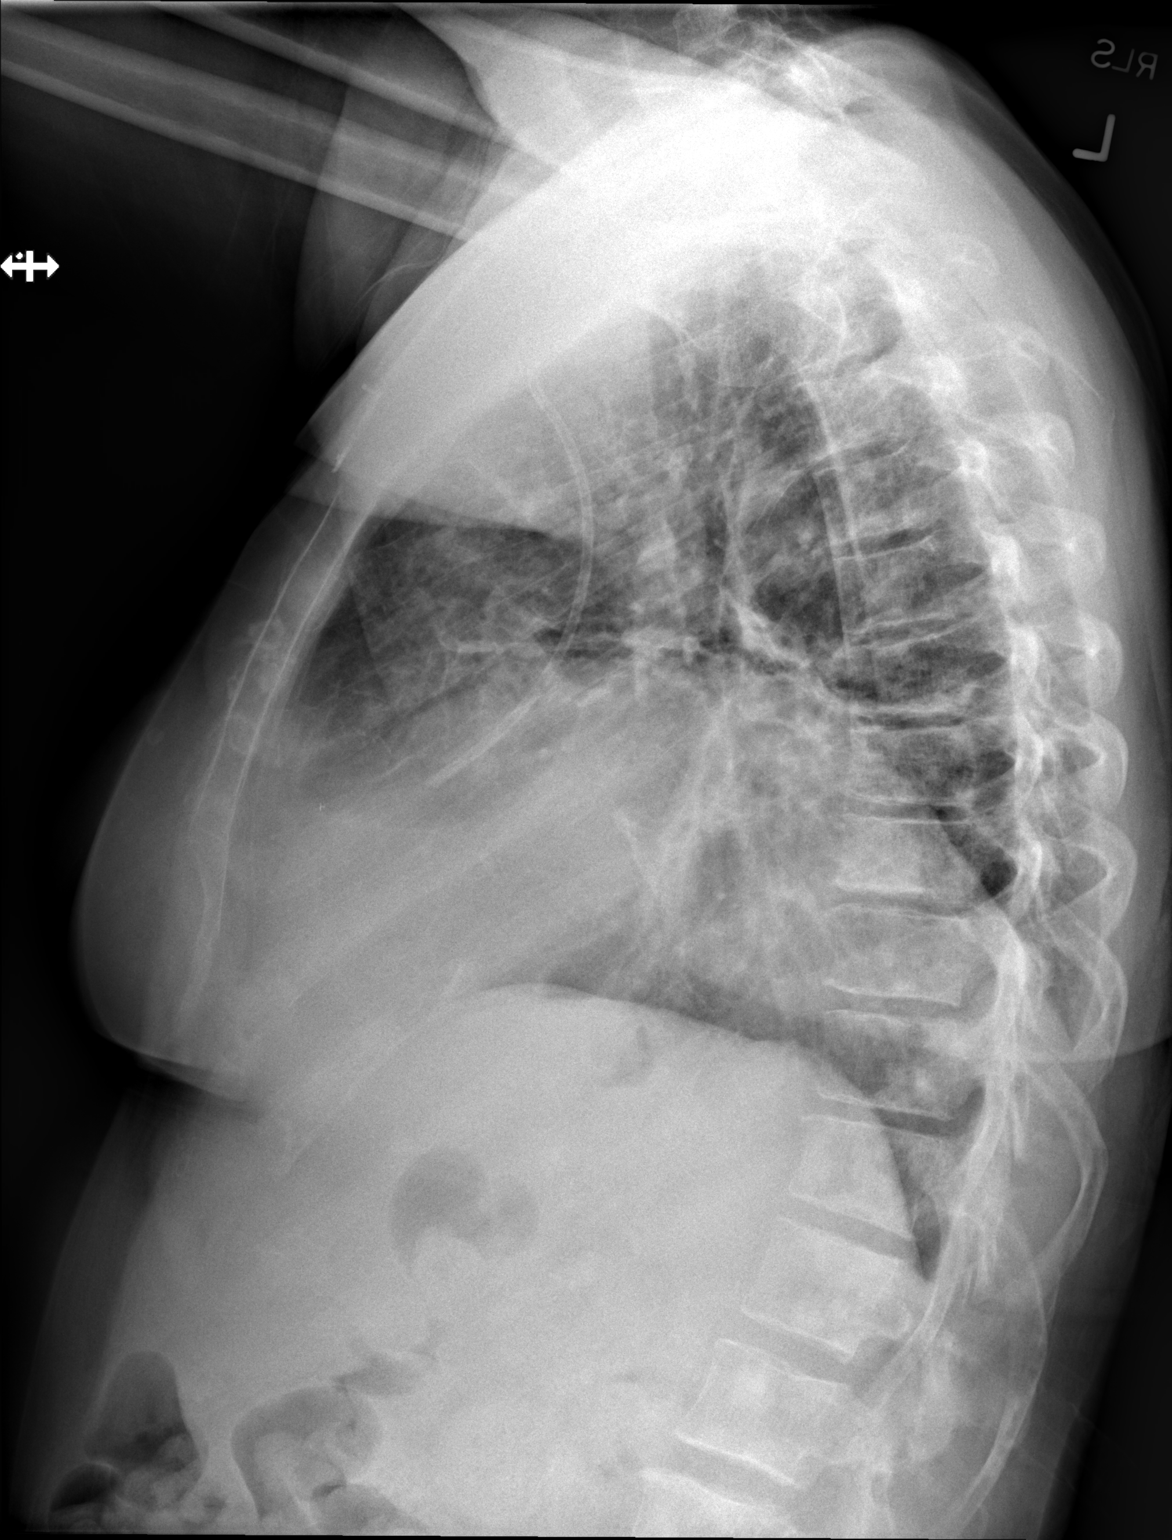

[2 of 2 positions shown; findings below may reference images not displayed]

FINDINGS: Heart size is normal. Moderately elevated right hemidiaphragm, unchanged. No pneumothorax. No pleural effusion. Moderate right basilar atelectasis/scarring, similar to prior. Left-sided chest port
present. No kinking or discontinuity of the chest port catheter. Widespread sclerotic metastatic disease, similar to prior.
IMPRESSION: 1.  No acute chest findings.
2.  Moderate right basilar atelectasis or scarring, similar to prior chest CT of 05/24/2021. Superimposed acute infection difficult to exclude.

## 2022-07-12 IMAGING — MR MRI ABDOMEN WO/W CONTRAST
9 series · 45 of 48 positions shown · IV contrast (15cc prohance)
Comparison: CT PET study 05/30/2022.

Images Obtained from [facility]
******** ADDENDUM #1 ********
Addendum:
Motion artifacts seen on axial T2 fat suppression images, limiting evaluation.
Areas of dilated intrahepatic biliary ducts seen, especially involving right hepatic lobe posterior segment. There is narrowing of right hepatic duct, common hepatic duct and common bile duct, due to
mass lesions of liver.
Pancreatic duct is not dilated.
******** ORIGINAL REPORT ********
HISTORY: Secondary malignant neoplasm of liver and intrahepatic bile duct, Abnormal results of liver function studies, Left upper quadrant pain
TECHNIQUE: Coronal and axial noncontrast and contrast MRI abdomen study performed without and with 15 mL ProHance.

[Series 1: bSSFP · axial · 8.0mm · 1.76mm/px · 1 of 22 slices shown]
[im 1/22]
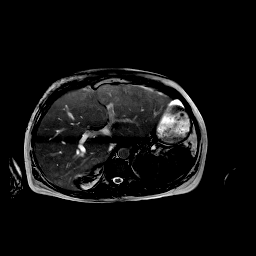

[Series 2: t2_cor_haste · coronal · 7.0mm · 0.73mm/px · 2 of 26 slices shown]
[im 1/26]
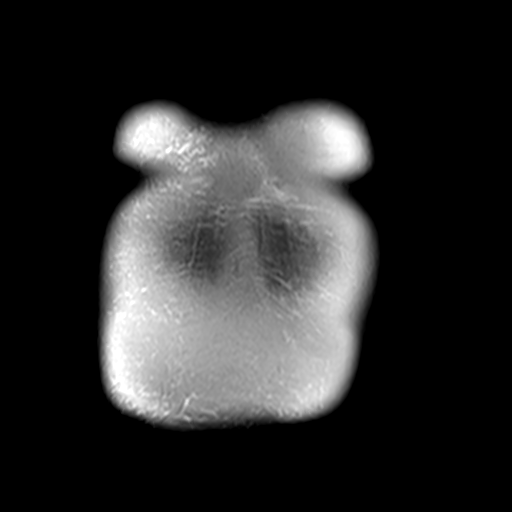
[im 26/26]
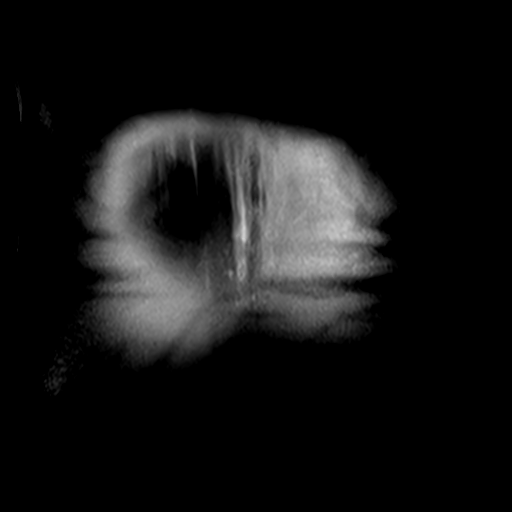

[Series 3: t2_axial_fs · axial · 6.0mm · 1.64mm/px · z∈[-105,+205]mm · 3 of 44 slices shown]
[im 1/44]
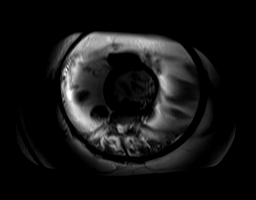
[im 22/44]
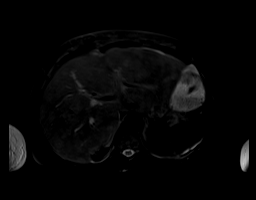
[im 44/44]
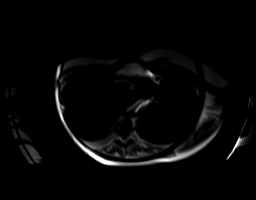

[Series 4: t1_vibe_(person_name)_axial_fs_in · axial · 3.5mm · 1.31mm/px · z∈[-102,+202]mm · 7 of 88 slices shown]
[im 1/88]
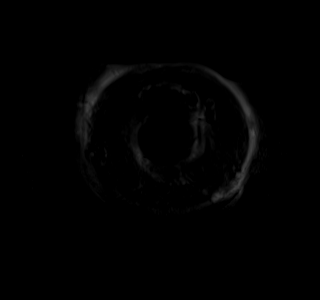
[im 15/88]
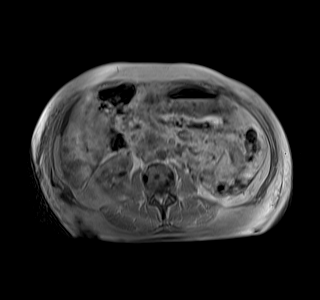
[im 30/88]
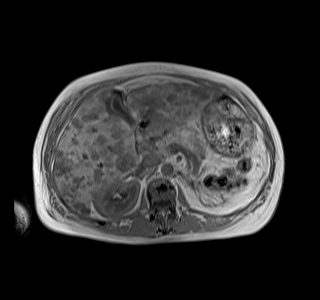
[im 44/88]
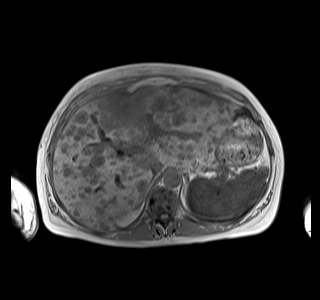
[im 59/88]
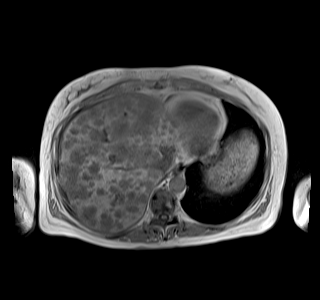
[im 73/88]
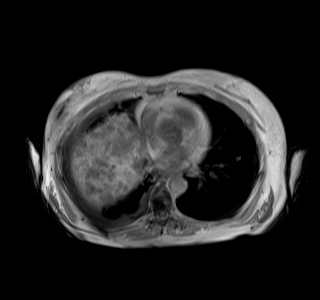
[im 88/88]
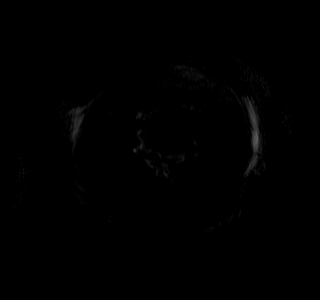

[Series 5: t1_vibe_(person_name)_axial_fs_opp · axial · 3.5mm · 1.31mm/px · z∈[-102,+202]mm · 7 of 88 slices shown]
[im 1/88]
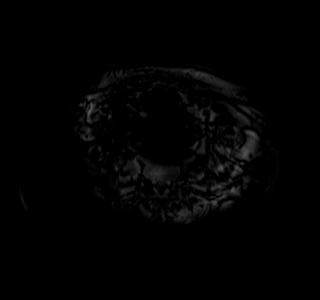
[im 15/88]
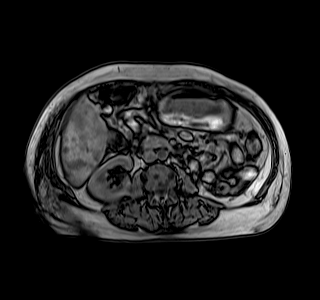
[im 30/88]
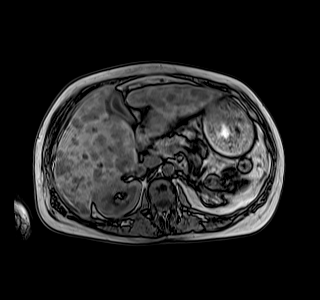
[im 44/88]
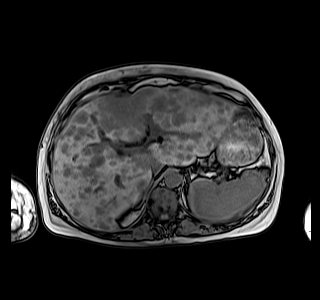
[im 59/88]
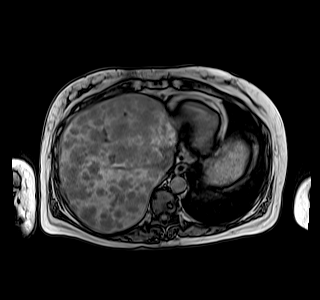
[im 73/88]
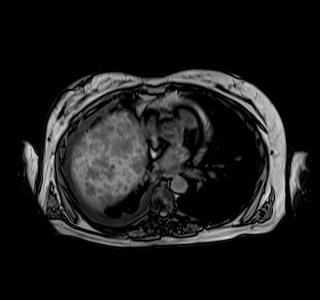
[im 88/88]
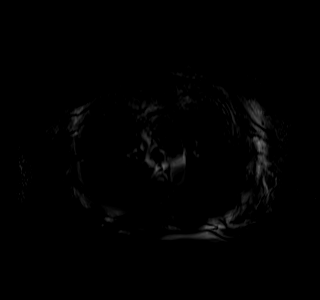

[Series 7: t1_vibe_(person_name)_axial_fs_w · axial · 3.5mm · 1.31mm/px · z∈[-102,+202]mm · 7 of 88 slices shown]
[im 1/88]
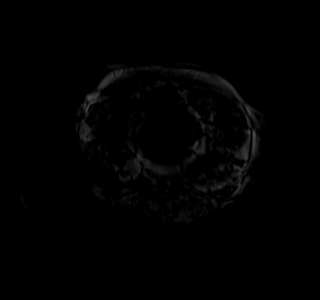
[im 15/88]
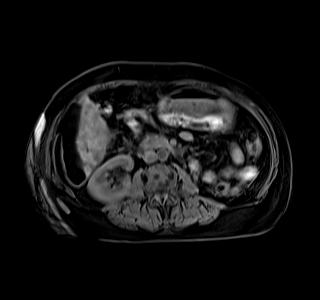
[im 30/88]
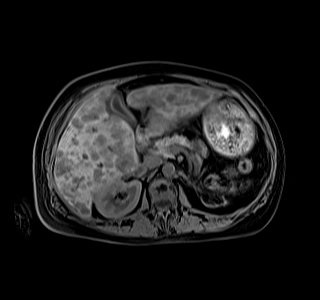
[im 44/88]
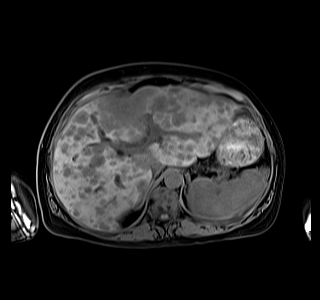
[im 59/88]
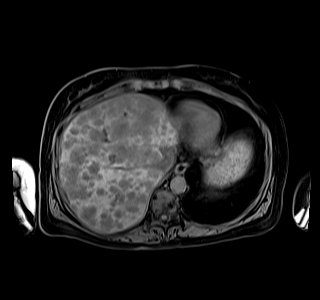
[im 73/88]
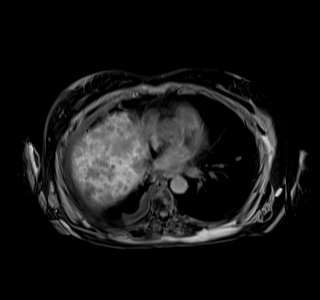
[im 88/88]
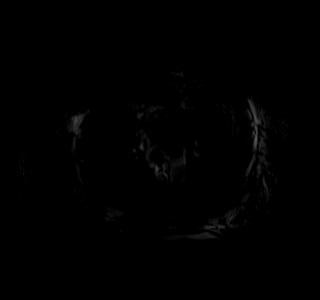

[Series 11: t1_vibe_(person_name)_axial_fs+c1_w · axial · 3.5mm · 1.31mm/px · z∈[-102,+202]mm · 7 of 88 slices shown]
[im 1/88]
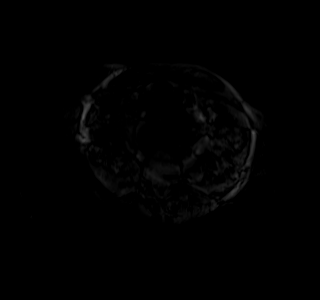
[im 15/88]
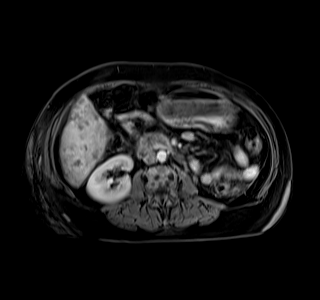
[im 30/88]
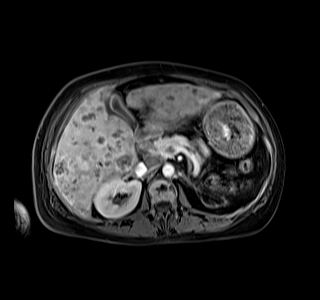
[im 44/88]
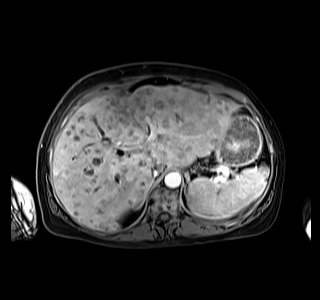
[im 59/88]
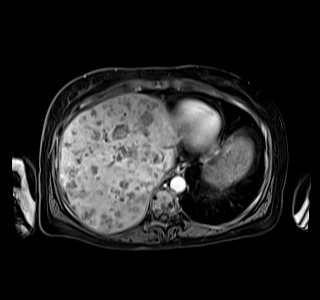
[im 73/88]
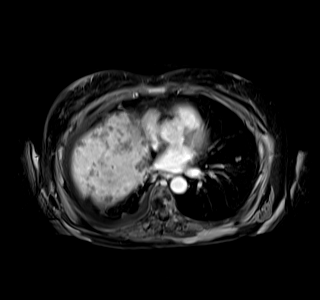
[im 88/88]
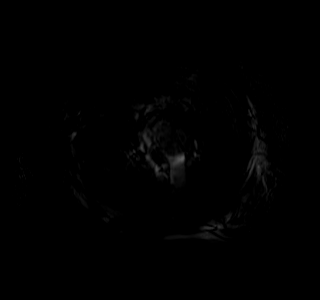

[Series 15: t1_vibe_(person_name)_axial_fs_+c2_w · axial · 3.5mm · 1.31mm/px · z∈[-102,+202]mm · 7 of 88 slices shown]
[im 1/88]
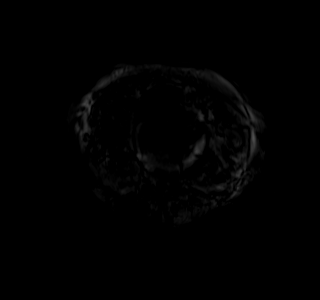
[im 15/88]
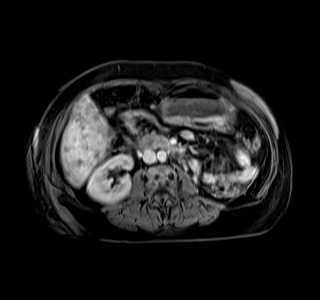
[im 30/88]
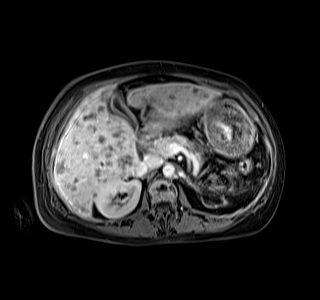
[im 44/88]
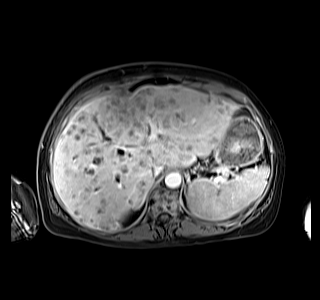
[im 59/88]
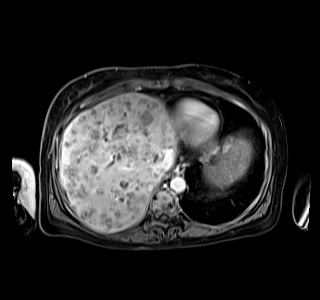
[im 73/88]
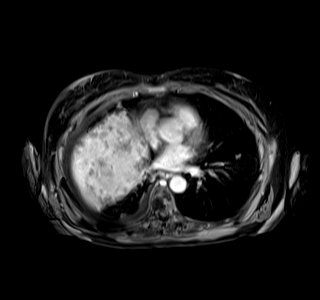
[im 88/88]
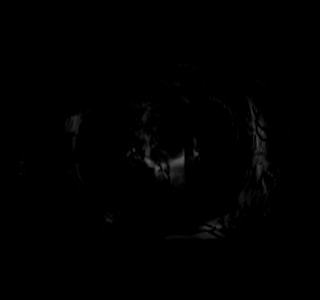

[Series 19: t1_vibe_(person_name)_axial_fs_+c3_w · axial · 3.5mm · 1.31mm/px · z∈[-102,+48]mm · 4 of 88 slices shown]
[im 1/88]
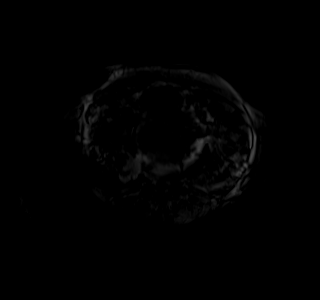
[im 15/88]
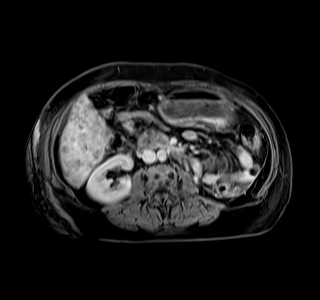
[im 30/88]
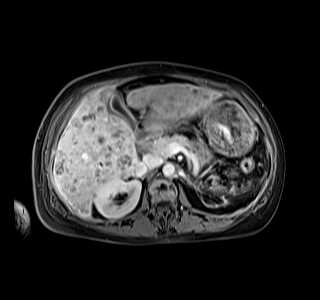
[im 44/88]
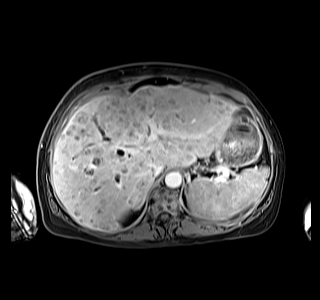

[45 of 48 positions shown; findings below may reference images not displayed]

FINDINGS: Elevated right hemidiaphragm relative to the left seen. Likely small pulmonary nodules identified bilaterally, approximately 10 mm and 5 mm in diameter of left lower lobe. Moderate
right-sided pleural effusion identified. No left-sided pleural effusion seen.
Heart size is normal. There is no pericardial effusion. Aorta and inferior vena cava are normal.
Numerous T1 hypointensity with slight T2 hyperintensity lesions of liver with peripheral enhancement identified with largest lesion or conglomerate lesion up to approximately 4.5 cm involving segment
I of liver. Conglomerate lesion involving segment IVb of liver up to 7 cm also seen. Hepatomegaly up to approximately 22 cm in diameter at midclavicular line seen. No obvious liver cyst or
intrahepatic inferior ductal dilation seen. Hepatic veins and portal veins are likely patent.
There is no cholelithiasis or gallbladder sludge. Small amount of fluid adjacent to the gallbladder at gallbladder fossa seen.
Pancreas and biliary duct are grossly unremarkable. Spleen is normal. Adrenal glands and right kidney are normal. Left kidney is not seen.
Distal esophagus, stomach and bowel loops are normal.
Small amount of perihepatic simple ascites.
No enlarged retroperitoneal lymph nodes seen.
Degenerative changes of spine with curvature of lumbar spine to right with apex at the L2 seen. Areas of sclerosis of spine and pelvis modified.
Visualized breast tissues are normal.
IMPRESSION: 1.  Numerous liver metastases identified, likely similar to prior CT PET study. Secondary hepatomegaly seen.
2.  Pulmonary nodules identified bilaterally, likely due to metastasis as seen on prior study.
3.  Likely sclerotic metastases again seen.
4.  Moderate right-sided pleural effusion seen new since prior study.
5.  Small amount of perihepatic simple ascites seen.
6.  Important abnormal findings.

## 2022-09-12 DEATH — deceased
